# Patient Record
Sex: Female | Born: 1975 | Race: Black or African American | Hispanic: No | Marital: Single | Smoking: Current every day smoker
Health system: Southern US, Community
[De-identification: ages and names within clinical notes are randomized; demographics above are authoritative.]

## PROBLEM LIST (undated history)

## (undated) DIAGNOSIS — F319 Bipolar disorder, unspecified: Secondary | ICD-10-CM

## (undated) DIAGNOSIS — F250 Schizoaffective disorder, bipolar type: Secondary | ICD-10-CM

## (undated) DIAGNOSIS — F259 Schizoaffective disorder, unspecified: Secondary | ICD-10-CM

---

## 2011-06-26 ENCOUNTER — Emergency Department (HOSPITAL_COMMUNITY)
Admission: EM | Admit: 2011-06-26 | Discharge: 2011-06-26 | Disposition: A | Discharge status: Home / Self Care | Payer: Self-pay | Attending: Emergency Medicine | Admitting: Emergency Medicine

## 2011-06-27 ENCOUNTER — Emergency Department (HOSPITAL_COMMUNITY): Payer: Medicaid - Out of State

## 2011-06-27 ENCOUNTER — Emergency Department (HOSPITAL_COMMUNITY)
Admission: EM | Admit: 2011-06-27 | Discharge: 2011-06-28 | Disposition: A | Discharge status: Other Acute Inpatient Hospital | Payer: Medicaid - Out of State | Attending: Emergency Medicine | Admitting: Emergency Medicine

## 2011-06-27 DIAGNOSIS — R51 Headache: Secondary | ICD-10-CM | POA: Insufficient documentation

## 2011-06-27 DIAGNOSIS — R000 Tachycardia, unspecified: Secondary | ICD-10-CM | POA: Insufficient documentation

## 2011-06-27 DIAGNOSIS — F29 Unspecified psychosis not due to a substance or known physiological condition: Secondary | ICD-10-CM | POA: Insufficient documentation

## 2011-06-27 DIAGNOSIS — R209 Unspecified disturbances of skin sensation: Secondary | ICD-10-CM | POA: Insufficient documentation

## 2011-06-27 LAB — URINALYSIS, ROUTINE W REFLEX MICROSCOPIC
Bilirubin Urine: NEGATIVE
Ketones, ur: NEGATIVE mg/dL
Leukocytes, UA: NEGATIVE
Nitrite: NEGATIVE
Specific Gravity, Urine: 1.021 (ref 1.005–1.030)
Urobilinogen, UA: 1 mg/dL (ref 0.0–1.0)

## 2011-06-27 LAB — RAPID URINE DRUG SCREEN, HOSP PERFORMED
Amphetamines: NOT DETECTED
Barbiturates: NOT DETECTED
Benzodiazepines: NOT DETECTED

## 2011-06-27 LAB — DIFFERENTIAL
Basophils Relative: 0 % (ref 0–1)
Eosinophils Absolute: 0.1 10*3/uL (ref 0.0–0.7)
Eosinophils Relative: 2 % (ref 0–5)
Lymphs Abs: 2 10*3/uL (ref 0.7–4.0)
Neutrophils Relative %: 56 % (ref 43–77)

## 2011-06-27 LAB — COMPREHENSIVE METABOLIC PANEL
ALT: 11 U/L (ref 0–35)
AST: 14 U/L (ref 0–37)
Calcium: 9.3 mg/dL (ref 8.4–10.5)
Potassium: 4 mEq/L (ref 3.5–5.1)
Sodium: 139 mEq/L (ref 135–145)
Total Protein: 7.3 g/dL (ref 6.0–8.3)

## 2011-06-27 LAB — CBC
MCV: 79.1 fL (ref 78.0–100.0)
Platelets: 261 10*3/uL (ref 150–400)
RBC: 3.87 MIL/uL (ref 3.87–5.11)
RDW: 14.4 % (ref 11.5–15.5)
WBC: 5.8 10*3/uL (ref 4.0–10.5)

## 2011-06-27 LAB — URINE MICROSCOPIC-ADD ON

## 2011-06-27 LAB — POCT PREGNANCY, URINE: Preg Test, Ur: NEGATIVE

## 2011-06-28 ENCOUNTER — Inpatient Hospital Stay (HOSPITAL_COMMUNITY)
Admission: RE | Admit: 2011-06-28 | Discharge: 2011-07-18 | DRG: 885 | Disposition: A | Discharge status: Home / Self Care | Payer: PRIVATE HEALTH INSURANCE | Source: Ambulatory Visit | Attending: Psychiatry | Admitting: Psychiatry

## 2011-06-28 DIAGNOSIS — D649 Anemia, unspecified: Secondary | ICD-10-CM

## 2011-06-28 DIAGNOSIS — F200 Paranoid schizophrenia: Secondary | ICD-10-CM

## 2011-06-28 DIAGNOSIS — Z79899 Other long term (current) drug therapy: Secondary | ICD-10-CM

## 2011-06-28 DIAGNOSIS — Z7982 Long term (current) use of aspirin: Secondary | ICD-10-CM

## 2011-06-30 NOTE — Assessment & Plan Note (Signed)
Nancy Howell, Nancy Howell NO.:  [id number]  MEDICAL RECORD NO.:  [id number]  LOCATION:  0404                          FACILITY:  BH  PHYSICIAN:  Eulogio Ditch, MD DATE OF BIRTH:  [birth year]  DATE OF ADMISSION:  06/28/2011 DATE OF DISCHARGE:                      PSYCHIATRIC ADMISSION ASSESSMENT   HISTORY OF PRESENT ILLNESS:  A 35 year old African American female with history of schizophrenia paranoid type, was admitted from [facility] on commitment papers.  The patient reported that she moved from [place] to live with a cousin in [place] as they were "tainting her food". She stated that it was causing her to have a stroke.  She stated when she came to live with a cousin in [place] they are also tainting her food and medications.  The patient Depakote level is less than 10 and she was also not taking her Zyprexa.  The patient denies suicidal or homicidal ideation.  She denied hearing voices.  The patient is guarded and gives concrete answers.  PAST PSYCH HOSPITALIZATION:  The patient reported that she has number of hospitalization in [place] includes [facility] four to five times.  SUBSTANCE ABUSE:  The patient denies abusing any drugs at this time. She has a history of using crack cocaine 1 year ago.  LABORATORY DATA:  Valproic acid less than 10, alcohol level less than 11.  Comprehensive metabolic panel within normal limits.  Urine drug screen is negative.  Urine analysis is within normal limits.  CBC hemoglobin 9.9, hematocrit 30.6.  Urine pregnancy test negative.  Physical examination done in [facility] ED within normal limits.  MEDICAL HISTORY:  No active medical issue.  ALLERGIES:  NO KNOWN DRUG ALLERGIES.  MENTAL STATUS EXAM:  The patient is guarded during the interview gives concrete answers.  The patient reported that she has headache.  She can talk on length today.  The patient is paranoid, seems to be  internally preoccupied but denies hearing any voices.  Her speech is soft, slow, mood depressed, affect mood congruent, but she is not suicidal or homicidal.  She is alert, awake, oriented x3.  Memory immediate, recent remote fair.  Attention and concentration poor.  Abstraction ability fair.  Insight and judgment poor.  DIAGNOSIS:  AXIS I:  Chronic schizophrenia paranoid type. AXIS II:  Deferred. AXIS III:  No active medical issue. AXIS IV:  Mental health issues. AXIS V:  40.  TREATMENT PLAN:  The patient will be continued on Depakote 500 mg twice a day.  She is on Zyprexa 5 mg at bedtime.  We will continue this medication and titrate it slowly depending upon the patient's symptoms. The patient will be also given Benadryl 50 mg for insomnia as p.r.n. and to prevent any EPS symptoms.  The patient is also on Ativan 1 mg every 6 hours p.r.n. along with Zyprexa 5 mg every 6 hours p.r.n.  Estimated length of stay in the hospital will be 5-7 days.  Will get more collateral information on this patient and we will also get the last discharge summary from the [facility] in [place].     Eulogio Ditch, MD  SA/MEDQ  D:  06/29/2011  T:  06/29/2011  Job:  [id number]  Electronically Signed by Eulogio Ditch  on 06/30/2011 08:19:01 AM

## 2011-07-02 LAB — IRON AND TIBC
Iron: 55 ug/dL (ref 42–135)
Saturation Ratios: 14 % — ABNORMAL LOW (ref 20–55)
TIBC: 401 ug/dL (ref 250–470)
UIBC: 346 ug/dL

## 2011-07-02 LAB — VALPROIC ACID LEVEL: Valproic Acid Lvl: 123.4 ug/mL — ABNORMAL HIGH (ref 50.0–100.0)

## 2011-07-02 LAB — CARDIAC PANEL(CRET KIN+CKTOT+MB+TROPI)
CK, MB: 1.4 ng/mL (ref 0.3–4.0)
Relative Index: INVALID (ref 0.0–2.5)
Total CK: 77 U/L (ref 7–177)
Troponin I: <0.3 ng/mL

## 2011-07-02 LAB — VITAMIN B12: Vitamin B-12: 612 pg/mL (ref 211–911)

## 2011-07-02 LAB — FERRITIN: Ferritin: 6 ng/mL — ABNORMAL LOW (ref 10–291)

## 2011-07-03 LAB — TSH: TSH: 0.373 u[IU]/mL (ref 0.350–4.500)

## 2011-07-15 DIAGNOSIS — F339 Major depressive disorder, recurrent, unspecified: Secondary | ICD-10-CM

## 2011-07-19 NOTE — Assessment & Plan Note (Signed)
Nancy Howell, NADER NO.:  [id number]  MEDICAL RECORD NO.:  [id number]  LOCATION:  0404                          FACILITY:  BH  PHYSICIAN:  Eulogio Ditch, MD DATE OF BIRTH:  12/12/1975  DATE OF ADMISSION:  06/28/2011 DATE OF DISCHARGE:                      PSYCHIATRIC ADMISSION ASSESSMENT   HISTORY OF PRESENT ILLNESS:  This is a 35 year old single African American female.  The commitment papers indicate that she is diagnosed as schizophrenic or schizoaffective bipolar type.  She has been hospitalized several times in [place], [place] at [facility].  She came to [place] 2 weeks ago because of paranoia.  She is still thinking that her family is trying to hurt her by contaminating her food, her makeup, etc., and she was now talking about suicide. [facility] [employer] took her to [place] yesterday, but she walked off.  So, today she was picked up and brought to the emergency department at [facility].  She thinks that she have a stroke back in April, and CT scan was done.  There was no evidence for a present stroke or a past strokes, and she believes that other people are jealous of her and tainting her food.  She stated that she was eating something, a sandwich, maybe from a [employer], and it made her "get high."  She has been clean and sober for 18 months.  She reports that she finished a program year at [place], [place].  PAST PSYCHIATRIC HISTORY:  She has had 4-5 inpatient stays she has been noncompliant with her medication for a few months.  She states she first began getting mental health treatment at age 26.  Unfortunately, her mother was an addict.  Her mother used heroin, cocaine and alcohol, and she always had learning differences.  SOCIAL HISTORY:  She went to the tenth grade.  She has never married. She has 5 children, none of whom are in her care.  She had 3 different fathers.  She states that she has not been allowed to get  SSI for 7 years.  She claims she cannot work, that she cut her wrists last year, and she is pending reinstatement of her SSI.  ALCOHOL AND DRUG HISTORY:  Her own alcohol and drug history, she is very proud of the fact that she has been clean for 18 months.  She is vague on details, but she used to use crack daily herself.  PRIMARY CARE PROVIDER:  She does not have one.  PSYCHIATRIC CARE:  She has established care up in [place], [place].  MEDICATIONS:  She is supposed to be taking: 1. Zyprexa 5 mg h.s. 2. Depakote at least 500 mg at bedtime.  DRUG ALLERGIES:  She has no known drug allergies.  POSITIVE PHYSICAL FINDINGS:  Well-developed, well-nourished African American female who has an unusual physical presentation and that she shaved her hair off.  She, otherwise, appears to be her stated age.  She is in no acute distress at this time.  Her vital signs showed she was afebrile 98.4-98.9, pulse was 92-102, respirations were 15-18 and blood pressure was 105/67 to 123/75.  Her urinalysis was negative.  She was anemic.  Hemoglobin 9.9, hematocrit is 30.6.  Interestingly enough, she had no abnormalities of her metabolic profile at all, and she had a completely negative UDS and no measurable alcohol.  MENTAL STATUS EXAM:  Tonight, she is alert and oriented.  She is appropriately groomed and dressed.  She appears to be adequately nourished.  Her speech was not pressured.  Her mood was, she could hold it together for a while, and then she lapsed into being paranoid.  Her thought processes are somewhat clear, rational and goal oriented.  She wants to get SSI reinstated as she "cannot work."  Judgment and insight are poor, concentration and memory are superficially intact. Intelligence is average to below average.  She is not actively suicidal or homicidal.  She is not having auditory or visual hallucinations per se, although, she does endorse feeling that people are poisoning her food and  watching her.  DIAGNOSES:  AXIS I:  Schizophrenia paranoid type with exacerbation due to noncompliance. AXIS II:  Deferred. AXIS III:  The patient reports a history for stroke a few months ago. However, there is no CT evidence nor physical findings to support this claim. AXIS IV:  No supports, limited finances.  She recently moved here from [place]. AXIS V:  30.  PLAN:  The plan is to admit for safety and stabilization.  We will reestablish compliance with her medications.  We will get her records from [facility] tomorrow, and we will be in contact with her family regarding discharge plans.     Mickie Leonarda Salon, P.A.-C.   ______________________________ Eulogio Ditch, MD    MD/MEDQ  D:  06/28/2011  T:  06/29/2011  Job:  [id number]  Electronically Signed by Jaci Lazier ADAMS P.A.-C. on 07/16/2011 01:07:45 PM Electronically Signed by Eulogio Ditch  on 07/19/2011 03:01:16 PM

## 2011-07-24 NOTE — Discharge Summary (Signed)
  Nancy Howell, Nancy Howell NO.:  [id number]  MEDICAL RECORD NO.:  [id number]  LOCATION:  0404                          FACILITY:  BH  PHYSICIAN:  Eulogio Ditch, MD DATE OF BIRTH:  [birth year]  DATE OF ADMISSION:  06/28/2011 DATE OF DISCHARGE:  07/18/2011                              DISCHARGE SUMMARY   HISTORY OF PRESENT ILLNESS:  Please see initial psych assessment for details.  Briefly, a 35 year old [place] American female with a history of schizophrenia who was admitted because she was very paranoid, was in her 44s tainted, and her medications were also tainted, so she was not taking her medications.  HOSPITAL COURSE:  During the hospital stay the patient was started on Haldol 5 mg at bedtime which was increased to 10 mg at bedtime along with Cogentin 1 mg twice a day.  The patient was also given Depakote 500 mg daily at bedtime.  The patient responded to the medication well without any side effects.  Her Depakote level on August 19 was 123 which increased after increasing the Depakote from 500 to 1000.  Her initial Depakote level on August 14 was less than 10.  So as her Depakote level went up to 123, the Depakote was reduced to 500 mg.  During the hospital stay, the patient did not participate in any groups, stayed all the time in her room.  But as reported by the staff, the patient was interacting good with other patient's and the staff, but the only thing was, she was not going to the groups.  On 07/18/2011, the patient was seen in the treatment team, and as she was logical and goal-directed, not hallucinating or delusional, not suicidal or homicide.  Her insight and judgment improved.  The patient was found stable to be discharged.  The patient's family was involved in the treatment.  The patient was discharged to the shelter.  The patient wanted to go back to [place] as she stated that she would end up with the same crowd, and she would be using  drugs.  DIAGNOSIS AT THE TIME OF DISCHARGE:  AXIS I: Chronic schizophrenia paranoid type AXIS II: Deferred AXIS III: No active medical issue AXIS IV: Chronic mental health issues AXIS V: 55  DISCHARGE MEDICATIONS: 1. Benztropine 1 mg twice daily 2. Haldol 10 mg at bedtime 3. Depakote 500 mg at bedtime 4. Aspirin 81 mg p.o. daily 5. Ferrous sulfate 25 mg p.o. daily 6. Loratadine 10 mg p.o. daily  DISCHARGE FOLLOWUP:  The patient will follow up at [facility], appointment on 09/06 between 9:00 and 11:00 a.m.     Eulogio Ditch, MD     SA/MEDQ  D:  07/19/2011  T:  07/19/2011  Job:  [id number]  Electronically Signed by Eulogio Ditch  on 07/24/2011 01:03:08 PM

## 2011-08-02 ENCOUNTER — Emergency Department (HOSPITAL_COMMUNITY)
Admission: EM | Admit: 2011-08-02 | Discharge: 2011-08-03 | Disposition: A | Discharge status: Other Acute Inpatient Hospital | Payer: Medicaid - Out of State | Attending: Emergency Medicine | Admitting: Emergency Medicine

## 2011-08-02 DIAGNOSIS — F1410 Cocaine abuse, uncomplicated: Secondary | ICD-10-CM | POA: Insufficient documentation

## 2011-08-02 DIAGNOSIS — Z8659 Personal history of other mental and behavioral disorders: Secondary | ICD-10-CM | POA: Insufficient documentation

## 2011-08-02 DIAGNOSIS — R45851 Suicidal ideations: Secondary | ICD-10-CM | POA: Insufficient documentation

## 2011-08-02 DIAGNOSIS — F3130 Bipolar disorder, current episode depressed, mild or moderate severity, unspecified: Secondary | ICD-10-CM | POA: Insufficient documentation

## 2011-08-02 LAB — COMPREHENSIVE METABOLIC PANEL
ALT: 10 U/L (ref 0–35)
Alkaline Phosphatase: 98 U/L (ref 39–117)
Chloride: 103 mEq/L (ref 96–112)
GFR calc Af Amer: >60 mL/min (ref >60)
Glucose, Bld: 83 mg/dL (ref 70–99)
Potassium: 3.8 mEq/L (ref 3.5–5.1)
Sodium: 139 mEq/L (ref 135–145)
Total Protein: 7.7 g/dL (ref 6.0–8.3)

## 2011-08-02 LAB — RAPID URINE DRUG SCREEN, HOSP PERFORMED
Amphetamines: NOT DETECTED
Barbiturates: NOT DETECTED
Benzodiazepines: NOT DETECTED

## 2011-08-02 LAB — CBC
Hemoglobin: 12.2 g/dL (ref 12.0–15.0)
MCHC: 33.1 g/dL (ref 30.0–36.0)
RBC: 4.6 MIL/uL (ref 3.87–5.11)
WBC: 8.1 10*3/uL (ref 4.0–10.5)

## 2011-08-03 ENCOUNTER — Inpatient Hospital Stay (HOSPITAL_COMMUNITY)
Admission: AD | Admit: 2011-08-03 | Discharge: 2011-08-08 | DRG: 897 | Disposition: A | Discharge status: Home / Self Care | Payer: PRIVATE HEALTH INSURANCE | Source: Ambulatory Visit | Attending: Psychiatry | Admitting: Psychiatry

## 2011-08-03 DIAGNOSIS — R45851 Suicidal ideations: Secondary | ICD-10-CM

## 2011-08-03 DIAGNOSIS — F1420 Cocaine dependence, uncomplicated: Secondary | ICD-10-CM

## 2011-08-03 DIAGNOSIS — Z7982 Long term (current) use of aspirin: Secondary | ICD-10-CM

## 2011-08-03 DIAGNOSIS — F17200 Nicotine dependence, unspecified, uncomplicated: Secondary | ICD-10-CM

## 2011-08-03 DIAGNOSIS — Z880 Allergy status to penicillin: Secondary | ICD-10-CM

## 2011-08-03 DIAGNOSIS — Z79899 Other long term (current) drug therapy: Secondary | ICD-10-CM

## 2011-08-03 DIAGNOSIS — F39 Unspecified mood [affective] disorder: Secondary | ICD-10-CM

## 2011-08-04 LAB — HCG, SERUM, QUALITATIVE: Preg, Serum: NEGATIVE

## 2011-08-05 LAB — URINALYSIS, ROUTINE W REFLEX MICROSCOPIC
Bilirubin Urine: NEGATIVE
Leukocytes, UA: NEGATIVE
Nitrite: NEGATIVE
Specific Gravity, Urine: 1.014 (ref 1.005–1.030)
Urobilinogen, UA: 0.2 mg/dL (ref 0.0–1.0)

## 2011-08-11 NOTE — Assessment & Plan Note (Signed)
Nancy Howell, SAVOIA NO.:  [id number]  MEDICAL RECORD NO.:  [id number]  LOCATION:  0301                          FACILITY:  BH  PHYSICIAN:  Orson Aloe, MD       DATE OF BIRTH:  [birth year]  DATE OF ADMISSION:  08/03/2011 DATE OF DISCHARGE:                      PSYCHIATRIC ADMISSION ASSESSMENT   TIME OF ASSESSMENT:  1430 hours.  IDENTIFYING INFORMATION:  This is a 35 year old African American female. This is a voluntary admission.  HISTORY OF PRESENT ILLNESS:  This is the second [facility] admission for Nancy Howell, a 35 year old who presents with an exacerbation of her polysubstance abuse and reported suicidal thoughts with a plan to walk out in front of traffic.  She had numerous IV needles in her hand when she arrived in triage.  Said she needed to get away from her family because she was unable to stay clean and sober.  She reports she has been using crack cocaine and alcohol heavily and is asking for help with substance abuse. She says that the Haldol injection that she received a month ago here at [facility] she felt was going to help keep her clean and sober but her family situation ends up exposing her to drugs on a regular basis.  She reports she has not taken any oral medications since she left [facility].  PAST PSYCHIATRIC HISTORY:  Previously admitted to [facility] from August 15 to July 18, 2003.  She received a Haldol decanoate injection on June 29, 2011, and was also started on oral Haldol dosing with a discharge dose of 10 mg Haldol immediate release q.h.s.  She was also discharged on 500 mg of Depakote q.h.s.  She has a history of psychosis, believing that family members were tainting her food with some type of poison and she had been refusing food and fluid.  She had previous hospitalization in [place] four to five times and had previously taken Zyprexa.  She was initially started on Zyprexa at [facility] but changed to Haldol in order to receive a Haldol  decanoate injection and for less potential for metabolic complications.  She was previously discharged with a plan to follow up at [facility] but did not keep her followup appointment.  SOCIAL HISTORY:  She completed the tenth grade, never married, has 5 children, none of whom are in her care.  She reports a history of previous suicide attempt by cutting her wrists last year.  She has a history of using crack cocaine daily but also has been clean at one point recently for up to 18 months.  She reports living with family here in [place].  FAMILY HISTORY:  Not available.  MEDICAL HISTORY:  No regular primary care provider.  MEDICAL PROBLEMS:  History of psychosis rule out schizophrenia versus bipolar disorder.  Previous hospitalizations for childbirth and previous hospitalizations in [place] for psychiatric reasons.  CURRENT MEDICATIONS:  Are none.  DRUG ALLERGIES:  PENICILLIN.  PHYSICAL EXAM:  Was done in the emergency room where she was medically stabilized.  This is a slim built [place] American female who was very sleepy today.  Feels that the medication is making her drowsy.  She weighs 61 kg, 5 feet 1 inch tall.  Temperature 98, pulse 92, respirations 15, blood pressure 100/68.  Her CBC is normal with a hemoglobin of 12.2.  Chemistry normal with sodium 139, potassium 3.8, chloride 103, carbon dioxide 29, BUN 7, creatinine 0.60.  Liver enzymes are normal.  Urine drug screen positive for cocaine.  Alcohol screen is negative.  Serum pregnancy test and routine UA are currently pending.  MENTAL STATUS EXAM:  This is a sleepy African American female who is bundled up under the covers.  Reports that she last ate last night and has kept a little bit of food down this morning but is too sleepy to eat regular meals.  Says she feels like the medication is making her drowsy and she received 10 mg p.o. of Haldol last night.  Thinking is logical. Today she is able to  respond to me about time frames, wants help getting back off drugs and can talk to me logically about the situation at home which is not good and her food preferences.  No delusional statements made.  No evidence of auditory hallucinations and no signs of internal distraction.  She denies suicidal thoughts today.  DIAGNOSIS:  AXIS I:  Mood disorder not otherwise specified.  Cocaine abuse rule out dependence. AXIS II:  Deferred. AXIS III:  No diagnosis. AXIS IV:  Significant social issues and home issues. AXIS V:  Current 40, past year not known.  PLAN:  To voluntarily admit her with a goal of alleviating her suicidal thoughts.  I am going to change her Haldol oral dose to 5 mg q.6 hours p.r.n. for psychosis until we are able to get a more coherent history and description of her symptoms.  We note that she did have a Haldol decanoate injection 1 month ago but will wait until she is more fully alert to discuss this treatment plan.  Meanwhile we are going to check a urinalysis, valproic acid level and serum HCG and she is getting 500 mg of Depakote q.h.s. and we are repeating her previous dose of ferrous sulfate daily and Claritin 10 mg daily and aspirin 81 mg daily.     Margaret A. Lorin Picket, N.P.   ______________________________ Orson Aloe, MD    MAS/MEDQ  D:  08/04/2011  T:  08/04/2011  Job:  [id number]  Electronically Signed by Kari Baars N.P. on 08/09/2011 05:06:07 PM Electronically Signed by Orson Aloe  on 08/11/2011 11:30:00 AM

## 2011-08-15 NOTE — Discharge Summary (Signed)
Nancy Howell, Nancy Howell.:  [id number]  MEDICAL RECORD NO.:  [id number]  LOCATION:  0301                          FACILITY:  BH  PHYSICIAN:  Orson Aloe, MD       DATE OF BIRTH:  06-Jan-1976  DATE OF ADMISSION:  08/03/2011 DATE OF DISCHARGE:  08/08/2011                              DISCHARGE SUMMARY   IDENTIFYING INFORMATION:  This is a 35 year old African American female. This is a voluntary admission.  HISTORY OF PRESENT ILLNESS:  Second [facility] admission for Nikhita, a 35 year old who presented with an exacerbation of polysubstance abuse and suicidal thoughts with a plan to walk out in front of traffic.  She had numerous IV needles in her hand when she arrived in triage at our emergency room. Said she needed to get away from her family because she was unable to stay clean and sober.  Reported that she had been using cocaine and alcohol heavily.  She had previously been on our unit and was discharged September 4 and at that time was stabilized with Haldol and Depakote. She also had received a Haldol Decanoate injection.  She reported not taking any medications since she left [facility] or keeping any follow-up appointments.  MEDICAL EVALUATION:  She was medically evaluated in the emergency room with no focal neurologic findings.  This is a normally developed female with no abnormal movements, normal gait, 61 kg 5 feet 1 inch tall. Vital signs were normal.  Chemistry, CBC unremarkable.  Normal renal function with normal liver enzymes.  Urine drug screen positive for cocaine.  Metabolites and alcohol screen negative.  COURSE OF HOSPITALIZATION:  We evaluated her valproate level which was 26.2 at the time of admission.  Urinalysis was normal.  Her group participation was spotty throughout her stay.  Serum HCG pregnancy test was found to be negative.  We elected to start her back on Haldol 10 mg p.o. q.h.s. and Depakote 500 mg p.o. q.h.s. along with ferrous  sulfate that she had been prescribed for anemia and Claritin 10 mg daily for perennial allergies.  Our case manager worked to get her into an ACT team.  She had a history of chronic substance abuse with recurrent and frequent relapses.  She expressed that her family was interested in having her placed for long- term treatment.  Ultimately, she declined staying here in [place] for treatment. Our case manager contacted her fiance who wires her 150 dollars to get a bus ticket to go home to [place].  She planned to go straight to the bus station and go on to [place] and stay with her fiance there and obtain treatment.  By the 25th she was in full contact with reality, alert and oriented, recent, remote memory intact.  Judgment and insight adequate and had no dangerous ideas.  DISCHARGE/PLAN:  Is to follow up with [facility] in [place], [place] on August 17, 2011 at 11:40 a.m.  DISCHARGE DIAGNOSIS:  Axis I:  Cocaine dependence, nicotine dependence, schizoaffective disorder. Axis II: Deferred. Axis III: No diagnosis. Axis IV: Moderate psychosocial stressors. Axis V: Current 50, past year not known.  DISCHARGE CONDITION:  Stable.  DISCHARGE MEDICATIONS: 1. Aspirin 81 mg daily. 2. Benztropine 1 mg q.h.s. 3. Depakote 500 mg q.h.s. 4. Ferrous sulfate 325 mg daily with a meal. 5. Haldol 2 mg b.i.d. 6. Loratadine 10 mg daily. 7. Trazodone 50 mg q.h.s.     Margaret A. Lorin Picket, N.P.   ______________________________ Orson Aloe, MD    MAS/MEDQ  D:  08/15/2011  T:  08/15/2011  Job:  [id number]  Electronically Signed by Kari Baars N.P. on 08/15/2011 01:30:28 PM Electronically Signed by Orson Aloe  on 08/15/2011 03:29:30 PM

## 2012-03-30 IMAGING — CT CT HEAD W/O CM
1 series · 16 of 28 positions shown, 20 images · non-contrast
Comparison: None.

CLINICAL DATA: Medical clearance.  Depression and anxiety.

CT HEAD WITHOUT CONTRAST
TECHNIQUE: Contiguous axial images were obtained from the base of
the skull through the vertex without contrast.

[Series 2: head_seq 4.5 h37s st · axial · 0.43mm/px · z∈[+1231,+1343]mm · 16 of 28 slices shown, 20 images]
[im 2/28  brain]
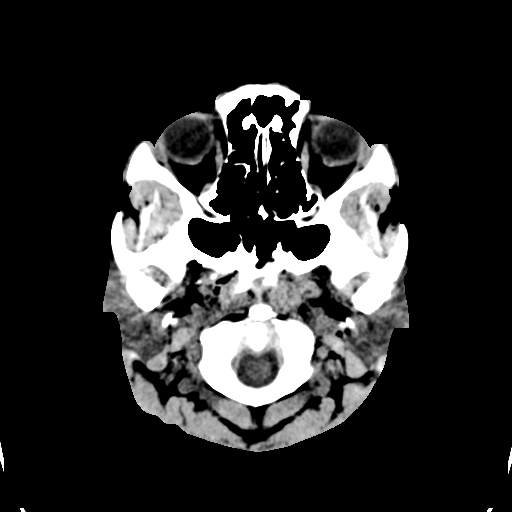
[im 2/28  bone]
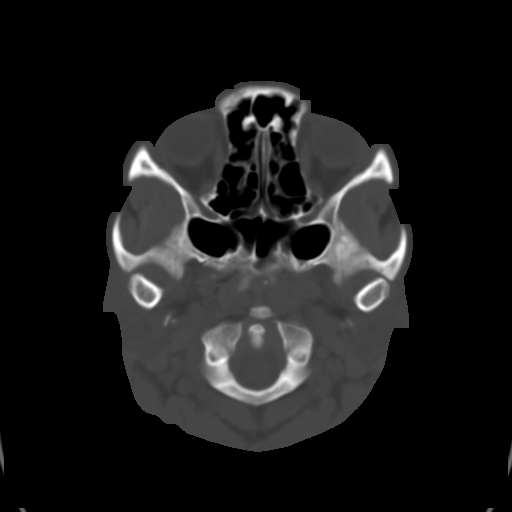
[im 4/28  brain]
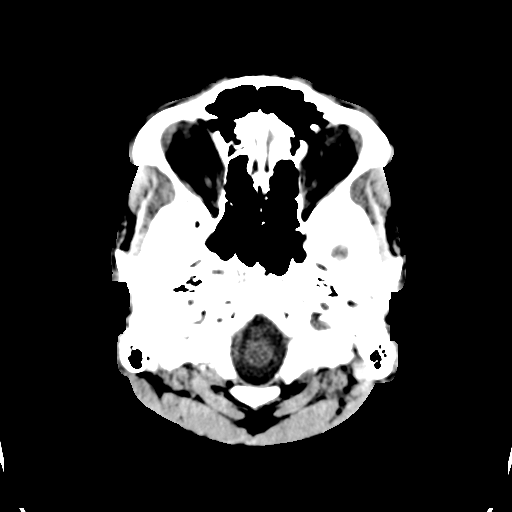
[im 6/28  brain]
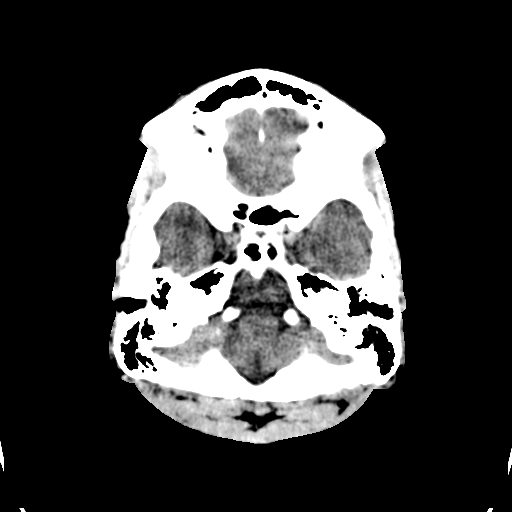
[im 7/28  brain]
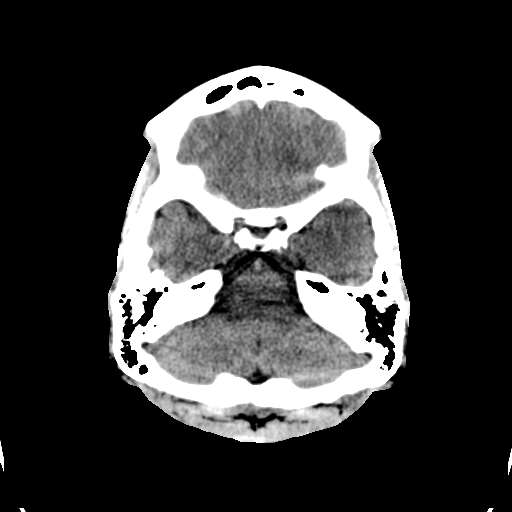
[im 9/28  brain]
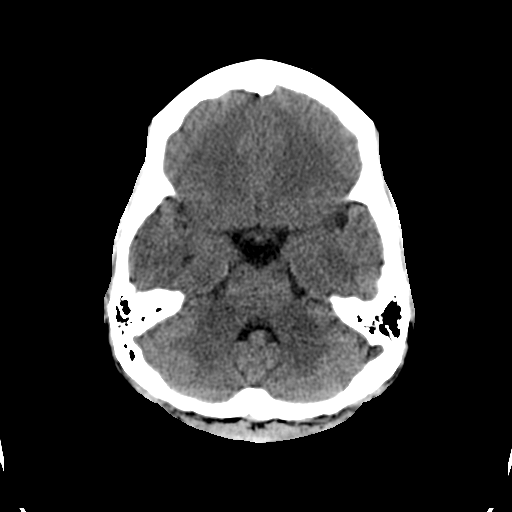
[im 9/28  bone]
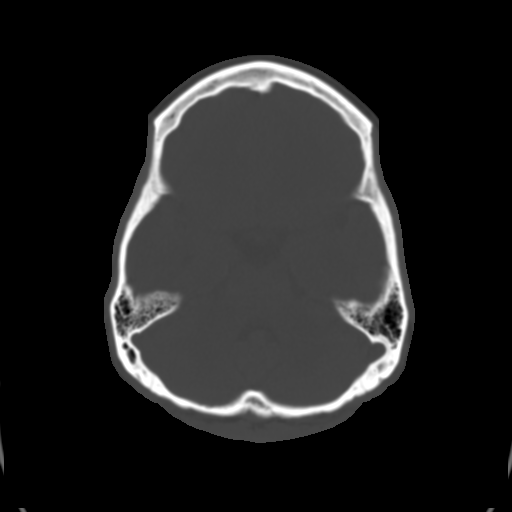
[im 10/28  brain]
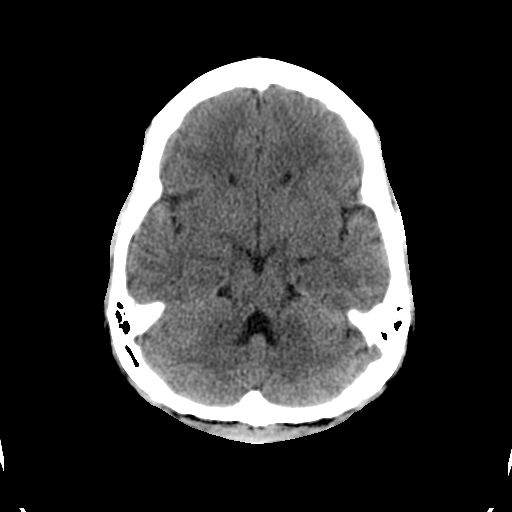
[im 12/28  brain]
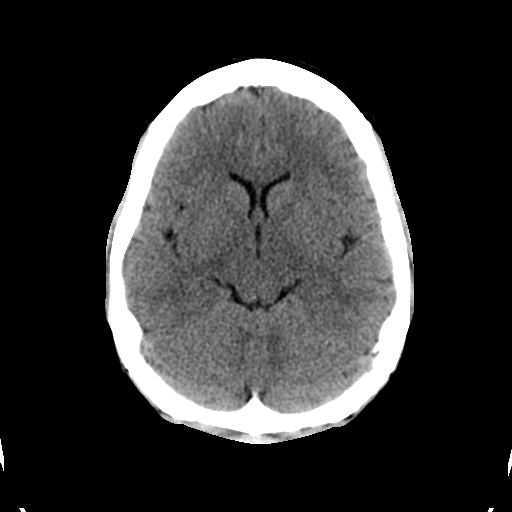
[im 14/28  brain]
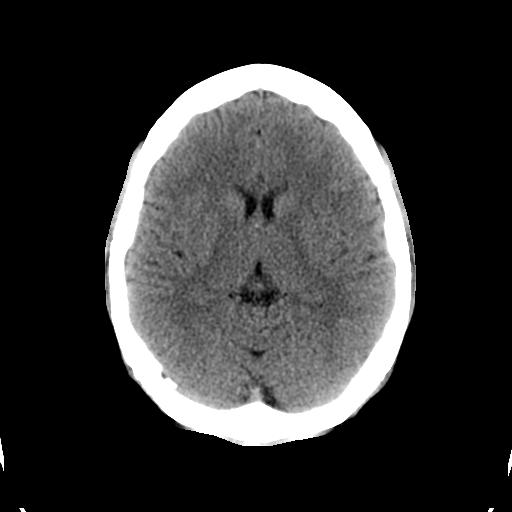
[im 15/28  brain]
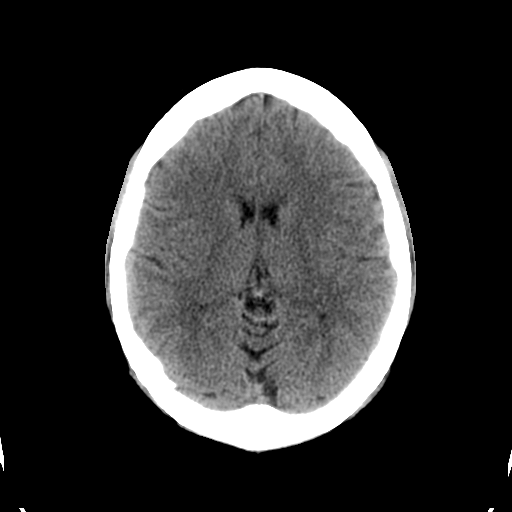
[im 15/28  bone]
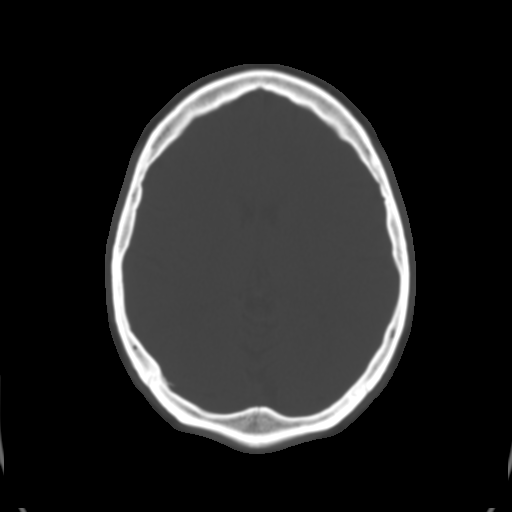
[im 17/28  brain]
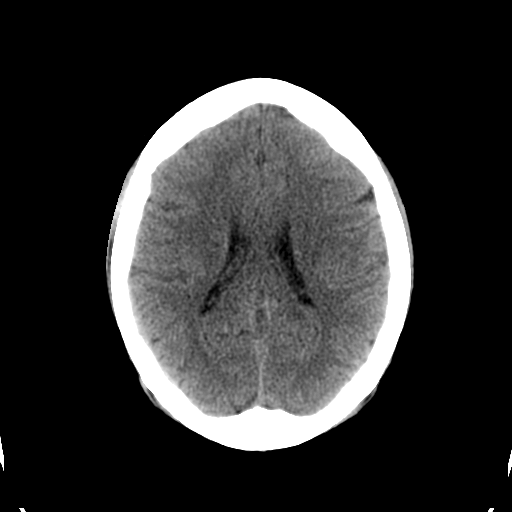
[im 19/28  brain]
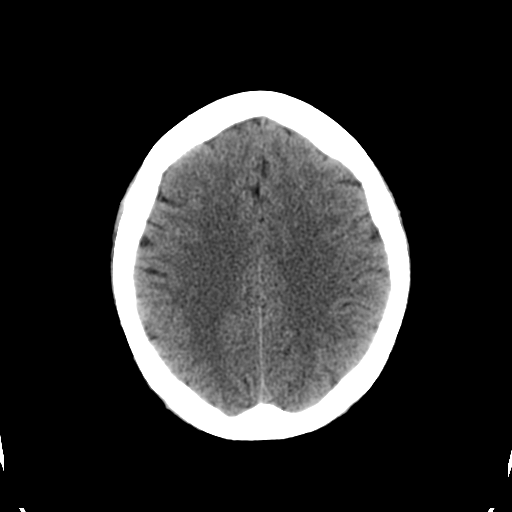
[im 20/28  brain]
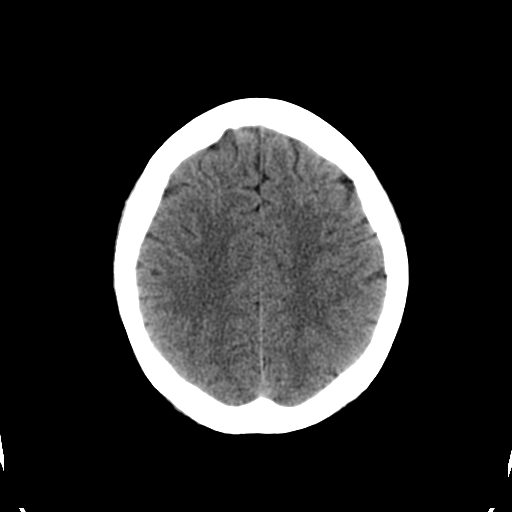
[im 22/28  brain]
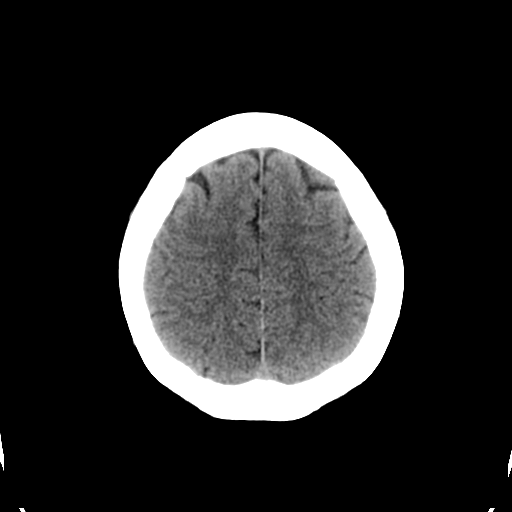
[im 22/28  bone]
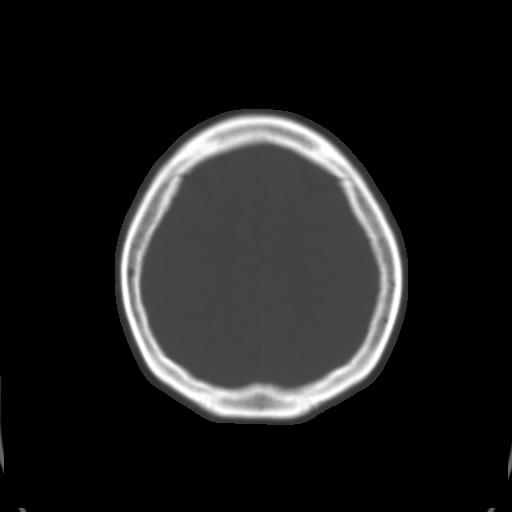
[im 23/28  brain]
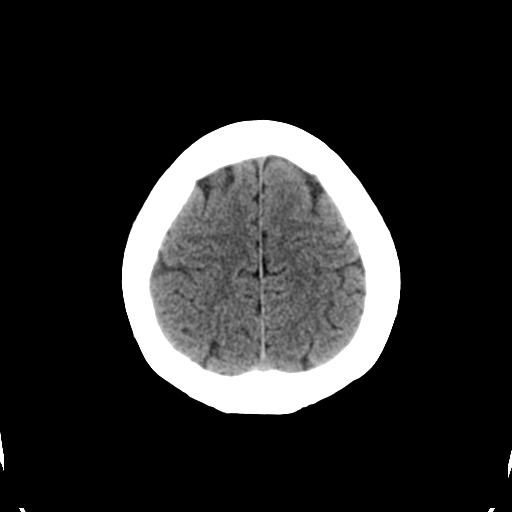
[im 25/28  brain]
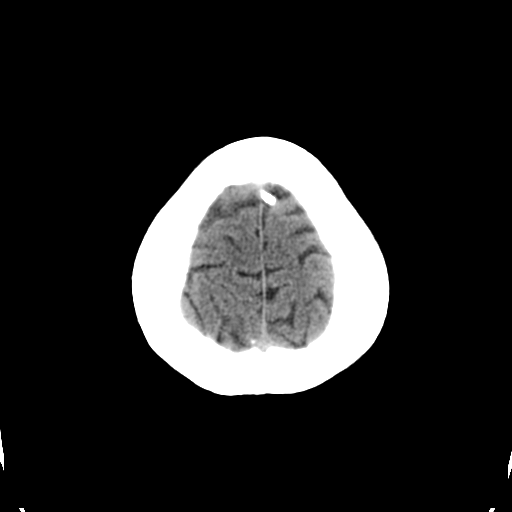
[im 27/28  brain]
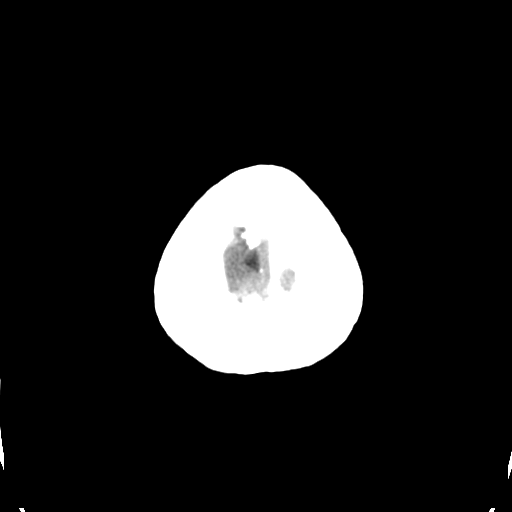

[16 of 28 positions shown; findings below may reference images not displayed]

FINDINGS: There is no evidence of acute intracranial hemorrhage,
mass lesion, brain edema or extra-axial fluid collection.  The
ventricles and subarachnoid spaces are appropriately sized for age.
There is no CT evidence of acute cortical infarction.

There is minimal ethmoid sinus mucosal thickening.  Visualized
paranasal sinuses are otherwise clear. The calvarium is intact.
IMPRESSION: No acute intracranial findings.  Minimal ethmoid sinus mucosal
thickening.

## 2016-10-11 ENCOUNTER — Encounter (HOSPITAL_COMMUNITY): Payer: Self-pay | Admitting: Emergency Medicine

## 2016-10-11 ENCOUNTER — Emergency Department (HOSPITAL_COMMUNITY)
Admission: EM | Admit: 2016-10-11 | Discharge: 2016-10-11 | Disposition: A | Discharge status: Home / Self Care | Payer: Medicaid - Out of State | Attending: Emergency Medicine | Admitting: Emergency Medicine

## 2016-10-11 DIAGNOSIS — Z760 Encounter for issue of repeat prescription: Secondary | ICD-10-CM | POA: Insufficient documentation

## 2016-10-11 DIAGNOSIS — F17210 Nicotine dependence, cigarettes, uncomplicated: Secondary | ICD-10-CM | POA: Insufficient documentation

## 2016-10-11 HISTORY — DX: Bipolar disorder, unspecified: F31.9

## 2016-10-11 HISTORY — DX: Schizoaffective disorder, bipolar type: F25.0

## 2016-10-11 HISTORY — DX: Schizoaffective disorder, unspecified: F25.9

## 2016-10-11 MED ORDER — HALOPERIDOL DECANOATE 100 MG/ML IM SOLN
100.0000 mg | Freq: Once | INTRAMUSCULAR | Status: AC
  Administered 2016-10-11: 100 mg via INTRAMUSCULAR
  Filled 2016-10-11 (×2): qty 1

## 2016-10-11 NOTE — ED Triage Notes (Signed)
Patient from [place] with schizoaffective disorder [place] and now needs her monthly Haldol shot and refill on meds until she finds a doctor down here.  Patient reports she is not depressed or having an episode only needs her meds.

## 2016-10-11 NOTE — Discharge Instructions (Signed)
[place]. Take your medications.

## 2016-10-11 NOTE — ED Provider Notes (Signed)
[facility]-EMERGENCY DEPT Provider Note   CSN: [id number] Arrival date & time: 10/11/16  1349     History   Chief Complaint Chief Complaint  Patient presents with  . Medication Refill    HPI Nancy Howell is a 40 y.o. female.  HPI Patient presents requesting her monthly Haldol shot. States she is on 100 mg IM monthly. States she last had at the beginning of October. [place] She has frequent psychiatric issues up there. She is on Depakote and Haldol and Cogentin. States she also gets 100 mg of the depo Haldol. States she is not depressed. Not hallucinating. She does not have resources here yet.   Past Medical History:  Diagnosis Date  . Bipolar 1 disorder (HCC)   . Schizo affective schizophrenia (HCC)     There are no active problems to display for this patient.   No past surgical history on file.  OB History    No data available       Home Medications    Prior to Admission medications   Medication Sig Start Date End Date Taking? Authorizing Provider  benztropine (COGENTIN) 0.5 MG tablet Take 0.5 mg by mouth 2 (two) times daily as needed for tremors.   Yes Historical Provider, MD  divalproex (DEPAKOTE) 500 MG DR tablet Take 500 mg by mouth at bedtime.   Yes Historical Provider, MD  haloperidol (HALDOL) 5 MG tablet Take 5 mg by mouth at bedtime.   Yes Historical Provider, MD  haloperidol decanoate (HALDOL DECANOATE) 100 MG/ML injection Inject 100 mg into the muscle every 28 (twenty-eight) days.   Yes Historical Provider, MD    Family History No family history on file.  Social History Social History  Substance Use Topics  . Smoking status: Current Every Day Smoker    Packs/day: 0.50    Types: Cigarettes  . Smokeless tobacco: Not on file  . Alcohol use No     Allergies   Banana and Penicillins   Review of Systems Review of Systems  Constitutional: Negative for appetite change.  HENT: Negative for congestion.   Respiratory:  Negative for shortness of breath.   Cardiovascular: Negative for chest pain.  Gastrointestinal: Negative for abdominal pain.  Genitourinary: Negative for dysuria.  Musculoskeletal: Negative for back pain.  Neurological: Negative for seizures.  Psychiatric/Behavioral: Negative for decreased concentration.     Physical Exam Updated Vital Signs BP 111/73 (BP Location: Left Arm)   Pulse 90   Temp 98.1 F (36.7 C)   Resp 16   Ht 5\' 1"  (1.549 m)   Wt 130 lb (59 kg)   LMP 10/08/2016   SpO2 100%   BMI 24.56 kg/m   Physical Exam  Constitutional: She appears well-developed.  HENT:  Head: Atraumatic.  Eyes: EOM are normal.  Cardiovascular: Normal rate.   Pulmonary/Chest: Effort normal.  Abdominal: Soft.  Musculoskeletal: She exhibits no edema.  Neurological: She is alert.  Skin: Skin is warm.  Psychiatric: She has a normal mood and affect.     ED Treatments / Results  Labs (all labs ordered are listed, but only abnormal results are displayed) Labs Reviewed - No data to display  EKG  EKG Interpretation None       Radiology No results found.  Procedures Procedures (including critical care time)  Medications Ordered in ED Medications  haloperidol decanoate (HALDOL DECANOATE) 100 MG/ML injection 100 mg (100 mg Intramuscular Given 10/11/16 1602)     Initial Impression / Assessment and  Plan / ED Course  I have reviewed the triage vital signs and the nursing notes.  Pertinent labs & imaging results that were available during my care of the patient were reviewed by me and considered in my medical decision making (see chart for details).  Clinical Course     Patient presents requesting her monthly Haldol depo shot. Has not been taking her other medicines as prescribed either. Will give to patient. We'll give some resources for psychiatric follow-up. Will discharge home. She has had this previously.   Final Clinical Impressions(s) / ED Diagnoses   Final  diagnoses:  Medication refill    New Prescriptions New Prescriptions   No medications on file     Benjiman CoreNathan Jaylynn Siefert, MD 10/11/16 1606

## 2016-11-04 ENCOUNTER — Encounter (HOSPITAL_COMMUNITY): Payer: Self-pay | Admitting: Certified Nurse Midwife

## 2016-11-04 ENCOUNTER — Emergency Department (HOSPITAL_COMMUNITY)
Admission: EM | Admit: 2016-11-04 | Discharge: 2016-11-04 | Disposition: A | Discharge status: Home / Self Care | Payer: Medicaid - Out of State | Attending: Emergency Medicine | Admitting: Emergency Medicine

## 2016-11-04 DIAGNOSIS — R102 Pelvic and perineal pain: Secondary | ICD-10-CM | POA: Diagnosis not present

## 2016-11-04 DIAGNOSIS — F17210 Nicotine dependence, cigarettes, uncomplicated: Secondary | ICD-10-CM | POA: Diagnosis not present

## 2016-11-04 DIAGNOSIS — R1030 Lower abdominal pain, unspecified: Secondary | ICD-10-CM | POA: Diagnosis present

## 2016-11-04 LAB — CBC
HCT: 35.4 % — ABNORMAL LOW (ref 36.0–46.0)
HEMOGLOBIN: 11.3 g/dL — AB (ref 12.0–15.0)
MCH: 25.1 pg — AB (ref 26.0–34.0)
MCHC: 31.9 g/dL (ref 30.0–36.0)
MCV: 78.7 fL (ref 78.0–100.0)
Platelets: 193 10*3/uL (ref 150–400)
RBC: 4.5 MIL/uL (ref 3.87–5.11)
RDW: 16.8 % — ABNORMAL HIGH (ref 11.5–15.5)
WBC: 4.6 10*3/uL (ref 4.0–10.5)

## 2016-11-04 LAB — WET PREP, GENITAL
CLUE CELLS WET PREP: NONE SEEN
Sperm: NONE SEEN
TRICH WET PREP: NONE SEEN
YEAST WET PREP: NONE SEEN

## 2016-11-04 LAB — URINALYSIS, ROUTINE W REFLEX MICROSCOPIC
Bilirubin Urine: NEGATIVE
Glucose, UA: NEGATIVE mg/dL
HGB URINE DIPSTICK: NEGATIVE
Ketones, ur: NEGATIVE mg/dL
LEUKOCYTES UA: NEGATIVE
Nitrite: NEGATIVE
Protein, ur: NEGATIVE mg/dL
SPECIFIC GRAVITY, URINE: 1.012 (ref 1.005–1.030)
pH: 6 (ref 5.0–8.0)

## 2016-11-04 LAB — COMPREHENSIVE METABOLIC PANEL
ALT: 8 U/L — ABNORMAL LOW (ref 14–54)
ANION GAP: 9 (ref 5–15)
AST: 24 U/L (ref 15–41)
Albumin: 3.6 g/dL (ref 3.5–5.0)
Alkaline Phosphatase: 89 U/L (ref 38–126)
BUN: 8 mg/dL (ref 6–20)
CALCIUM: 9.1 mg/dL (ref 8.9–10.3)
CHLORIDE: 107 mmol/L (ref 101–111)
CO2: 22 mmol/L (ref 22–32)
Creatinine, Ser: 0.71 mg/dL (ref 0.44–1.00)
GFR calc non Af Amer: >60 mL/min (ref >60)
Glucose, Bld: 83 mg/dL (ref 65–99)
Potassium: 3.8 mmol/L (ref 3.5–5.1)
SODIUM: 138 mmol/L (ref 135–145)
Total Bilirubin: 0.3 mg/dL (ref 0.3–1.2)
Total Protein: 6.6 g/dL (ref 6.5–8.1)

## 2016-11-04 LAB — LIPASE, BLOOD: LIPASE: 19 U/L (ref 11–51)

## 2016-11-04 LAB — I-STAT BETA HCG BLOOD, ED (MC, WL, AP ONLY)

## 2016-11-04 MED ORDER — KETOROLAC TROMETHAMINE 60 MG/2ML IM SOLN
60.0000 mg | Freq: Once | INTRAMUSCULAR | Status: AC
  Administered 2016-11-04: 60 mg via INTRAMUSCULAR
  Filled 2016-11-04: qty 2

## 2016-11-04 MED ORDER — NAPROXEN 500 MG PO TABS: 500.0000 mg | ORAL_TABLET | Freq: Two times a day (BID) | ORAL | 0 refills | Status: DC

## 2016-11-04 MED ORDER — AZITHROMYCIN 250 MG PO TABS
1000.0000 mg | ORAL_TABLET | Freq: Once | ORAL | Status: AC
  Administered 2016-11-04: 1000 mg via ORAL
  Filled 2016-11-04: qty 4

## 2016-11-04 MED ORDER — CEFTRIAXONE SODIUM 1 G IJ SOLR
500.0000 mg | Freq: Once | INTRAMUSCULAR | Status: AC
  Administered 2016-11-04: 500 mg via INTRAMUSCULAR

## 2016-11-04 NOTE — ED Triage Notes (Signed)
Pt states that around Thanksgiving she began having abdominal pain and vaginal discharge. Pt states she has a hx of ovarian cysts and believes that to be the cause of her current pain.

## 2016-11-04 NOTE — ED Notes (Signed)
NAD at this time. Pt is stable and going home.  

## 2016-11-04 NOTE — ED Provider Notes (Signed)
[facility]-EMERGENCY DEPT Provider Note   CSN: [id number] Arrival date & time: 11/04/16  1040     History   Chief Complaint Chief Complaint  Patient presents with  . Abdominal Pain  . Vaginal Discharge    HPI Nancy Howell is a 40 y.o. female.  HPI  Lower burning abd pain, worsened since yesterday, constant , waxes and wanes for 2 days Hx of cyst rupture Has been seen in [place] for ruptured cyst and feels these symptoms are similar.   No associated symptoms No fevers, no vomiting, no nausea, no diarrhea, no constipation, no vaginal discharge or bleeding  Concerned that may have infection However no concern for STIs, had recent testing which was negatvie  Past Medical History:  Diagnosis Date  . Bipolar 1 disorder (HCC)   . Schizo affective schizophrenia (HCC)     There are no active problems to display for this patient.   History reviewed. No pertinent surgical history.  OB History    No data available       Home Medications    Prior to Admission medications   Medication Sig Start Date End Date Taking? Authorizing Provider  benztropine (COGENTIN) 0.5 MG tablet Take 0.5 mg by mouth 2 (two) times daily as needed for tremors.    Historical Provider, MD  divalproex (DEPAKOTE) 500 MG DR tablet Take 500 mg by mouth at bedtime.    Historical Provider, MD  haloperidol (HALDOL) 5 MG tablet Take 5 mg by mouth at bedtime.    Historical Provider, MD  haloperidol decanoate (HALDOL DECANOATE) 100 MG/ML injection Inject 100 mg into the muscle every 28 (twenty-eight) days.    Historical Provider, MD  naproxen (NAPROSYN) 500 MG tablet Take 1 tablet (500 mg total) by mouth 2 (two) times daily with a meal. 11/04/16   [name], MD    Family History No family history on file.  Social History Social History  Substance Use Topics  . Smoking status: Current Every Day Smoker    Packs/day: 0.50    Types: Cigarettes  . Smokeless tobacco: Never Used  . Alcohol use Yes     Comment:  socially     Allergies   Banana and Penicillins   Review of Systems Review of Systems  Constitutional: Negative for fever.  HENT: Negative for sore throat.   Eyes: Negative for visual disturbance.  Respiratory: Negative for cough and shortness of breath.   Cardiovascular: Negative for chest pain.  Gastrointestinal: Positive for abdominal pain. Negative for constipation, diarrhea, nausea and vomiting.  Genitourinary: Positive for pelvic pain. Negative for difficulty urinating, vaginal bleeding and vaginal discharge.  Musculoskeletal: Negative for back pain and neck pain.  Skin: Negative for rash.  Neurological: Negative for syncope and headaches.     Physical Exam Updated Vital Signs BP 100/73   Pulse 75   Temp 98.8 F (37.1 C) (Oral)   Resp 16   Ht 5\' 1"  (1.549 m)   Wt 130 lb (59 kg)   LMP 10/08/2016   SpO2 100%   BMI 24.56 kg/m   Physical Exam  Constitutional: She is oriented to person, place, and time. She appears well-developed and well-nourished. No distress.  HENT:  Head: Normocephalic and atraumatic.  Eyes: Conjunctivae and EOM are normal.  Neck: Normal range of motion.  Cardiovascular: Normal rate, regular rhythm, normal heart sounds and intact distal pulses.  Exam reveals no gallop and no friction rub.   No murmur heard. Pulmonary/Chest: Effort normal and breath sounds normal. No  respiratory distress. She has no wheezes. She has no rales.  Abdominal: Soft. She exhibits no distension. There is tenderness (mild bilateral and suprapubic). There is no guarding.  Genitourinary: Uterus is not tender. Cervix exhibits no motion tenderness. Right adnexum displays tenderness. Left adnexum displays tenderness. Vaginal discharge (scant) found.  Musculoskeletal: She exhibits no edema or tenderness.  Neurological: She is alert and oriented to person, place, and time.  Skin: Skin is warm and dry. No rash noted. She is not diaphoretic. No erythema.  Nursing note and vitals  reviewed.    ED Treatments / Results  Labs (all labs ordered are listed, but only abnormal results are displayed) Labs Reviewed  WET PREP, GENITAL - Abnormal; Notable for the following:       Result Value   WBC, Wet Prep HPF POC MANY (*)    All other components within normal limits  COMPREHENSIVE METABOLIC PANEL - Abnormal; Notable for the following:    ALT 8 (*)    All other components within normal limits  CBC - Abnormal; Notable for the following:    Hemoglobin 11.3 (*)    HCT 35.4 (*)    MCH 25.1 (*)    RDW 16.8 (*)    All other components within normal limits  LIPASE, BLOOD  URINALYSIS, ROUTINE W REFLEX MICROSCOPIC  I-STAT BETA HCG BLOOD, ED ([facility], [facility], [facility] ONLY)  GC/CHLAMYDIA PROBE AMP ([place]) NOT AT [facility]    EKG  EKG Interpretation None       Radiology No results found.  Procedures Procedures (including critical care time)  Medications Ordered in ED Medications  ketorolac (TORADOL) injection 60 mg (60 mg Intramuscular Given 11/04/16 1427)  cefTRIAXone (ROCEPHIN) injection 500 mg (500 mg Intramuscular Given 11/04/16 1426)  azithromycin (ZITHROMAX) tablet 1,000 mg (1,000 mg Oral Given 11/04/16 1426)     Initial Impression / Assessment and Plan / ED Course  I have reviewed the triage vital signs and the nursing notes.  Pertinent labs & imaging results that were available during my care of the patient were reviewed by me and considered in my medical decision making (see chart for details).  Clinical Course    40yo female presents with concern for abdomina/pelvic pain. Pregnancy test negative. Labs WNL. Urinalysis negative. Wet prep with WBC. Doubt ovarian torsion/nephrolithiasis given no colicky pain, benign exam, bilateral symptoms. Doubt appendicitis/diverticulitis by hx and exam. Pt reports recent testing for STIs, has no  Significant vaginal discharge, no CMT, and overally low suspicion for PID.  Given pt with similar symptoms with cyst rupture in the  past, feel this could be likely etiology. Rec antiinflammatories, PCP and OBGYN follow up. Patient discharged in stable condition with understanding of reasons to return.    Final Clinical Impressions(s) / ED Diagnoses   Final diagnoses:  Lower abdominal pain, suspect ovarian cyst rupture    New Prescriptions Discharge Medication List as of 11/04/2016  2:36 PM    START taking these medications   Details  naproxen (NAPROSYN) 500 MG tablet Take 1 tablet (500 mg total) by mouth 2 (two) times daily with a meal., Starting Sat 11/04/2016, Print         Alvira MondayErin Saveah Bahar, MD 11/04/16 2055

## 2016-11-07 LAB — GC/CHLAMYDIA PROBE AMP (~~LOC~~) NOT AT ARMC
Chlamydia: NEGATIVE
Neisseria Gonorrhea: NEGATIVE

## 2024-08-12 ENCOUNTER — Other Ambulatory Visit: Payer: Self-pay

## 2024-08-12 ENCOUNTER — Encounter (HOSPITAL_COMMUNITY): Payer: Self-pay | Admitting: Psychiatry

## 2024-08-12 ENCOUNTER — Encounter (HOSPITAL_BASED_OUTPATIENT_CLINIC_OR_DEPARTMENT_OTHER): Payer: Self-pay

## 2024-08-12 ENCOUNTER — Inpatient Hospital Stay (HOSPITAL_COMMUNITY)
Admission: AD | Admit: 2024-08-12 | Discharge: 2024-09-03 | DRG: 897 | Disposition: A | Discharge status: Home / Self Care | Source: Intra-hospital

## 2024-08-12 ENCOUNTER — Emergency Department (HOSPITAL_BASED_OUTPATIENT_CLINIC_OR_DEPARTMENT_OTHER)
Admission: EM | Admit: 2024-08-12 | Discharge: 2024-08-12 | Disposition: A | Discharge status: Other Acute Inpatient Hospital | Payer: Self-pay | Attending: Emergency Medicine | Admitting: Emergency Medicine

## 2024-08-12 DIAGNOSIS — F1410 Cocaine abuse, uncomplicated: Secondary | ICD-10-CM | POA: Diagnosis present

## 2024-08-12 DIAGNOSIS — Z91148 Patient's other noncompliance with medication regimen for other reason: Secondary | ICD-10-CM | POA: Diagnosis not present

## 2024-08-12 DIAGNOSIS — F1420 Cocaine dependence, uncomplicated: Secondary | ICD-10-CM

## 2024-08-12 DIAGNOSIS — F259 Schizoaffective disorder, unspecified: Principal | ICD-10-CM | POA: Diagnosis present

## 2024-08-12 DIAGNOSIS — R4781 Slurred speech: Secondary | ICD-10-CM | POA: Diagnosis present

## 2024-08-12 DIAGNOSIS — N939 Abnormal uterine and vaginal bleeding, unspecified: Secondary | ICD-10-CM | POA: Diagnosis present

## 2024-08-12 DIAGNOSIS — F529 Unspecified sexual dysfunction not due to a substance or known physiological condition: Secondary | ICD-10-CM | POA: Diagnosis not present

## 2024-08-12 DIAGNOSIS — F1520 Other stimulant dependence, uncomplicated: Secondary | ICD-10-CM | POA: Diagnosis present

## 2024-08-12 DIAGNOSIS — Z5971 Insufficient health insurance coverage: Secondary | ICD-10-CM

## 2024-08-12 DIAGNOSIS — G2401 Drug induced subacute dyskinesia: Secondary | ICD-10-CM | POA: Diagnosis present

## 2024-08-12 DIAGNOSIS — R109 Unspecified abdominal pain: Secondary | ICD-10-CM | POA: Diagnosis not present

## 2024-08-12 DIAGNOSIS — Z555 Less than a high school diploma: Secondary | ICD-10-CM

## 2024-08-12 DIAGNOSIS — F1994 Other psychoactive substance use, unspecified with psychoactive substance-induced mood disorder: Secondary | ICD-10-CM | POA: Insufficient documentation

## 2024-08-12 DIAGNOSIS — F909 Attention-deficit hyperactivity disorder, unspecified type: Secondary | ICD-10-CM | POA: Diagnosis present

## 2024-08-12 DIAGNOSIS — F411 Generalized anxiety disorder: Secondary | ICD-10-CM | POA: Diagnosis present

## 2024-08-12 DIAGNOSIS — N898 Other specified noninflammatory disorders of vagina: Secondary | ICD-10-CM | POA: Diagnosis present

## 2024-08-12 DIAGNOSIS — Z880 Allergy status to penicillin: Secondary | ICD-10-CM | POA: Diagnosis not present

## 2024-08-12 DIAGNOSIS — M25511 Pain in right shoulder: Secondary | ICD-10-CM | POA: Diagnosis present

## 2024-08-12 DIAGNOSIS — F17210 Nicotine dependence, cigarettes, uncomplicated: Secondary | ICD-10-CM | POA: Diagnosis present

## 2024-08-12 DIAGNOSIS — G40909 Epilepsy, unspecified, not intractable, without status epilepticus: Secondary | ICD-10-CM | POA: Diagnosis present

## 2024-08-12 DIAGNOSIS — F17203 Nicotine dependence unspecified, with withdrawal: Secondary | ICD-10-CM | POA: Diagnosis not present

## 2024-08-12 DIAGNOSIS — F1490 Cocaine use, unspecified, uncomplicated: Secondary | ICD-10-CM | POA: Diagnosis not present

## 2024-08-12 DIAGNOSIS — Z91018 Allergy to other foods: Secondary | ICD-10-CM | POA: Diagnosis not present

## 2024-08-12 DIAGNOSIS — R45851 Suicidal ideations: Secondary | ICD-10-CM | POA: Insufficient documentation

## 2024-08-12 DIAGNOSIS — Z59869 Financial insecurity, unspecified: Secondary | ICD-10-CM | POA: Diagnosis not present

## 2024-08-12 DIAGNOSIS — F250 Schizoaffective disorder, bipolar type: Secondary | ICD-10-CM | POA: Diagnosis present

## 2024-08-12 DIAGNOSIS — F600 Paranoid personality disorder: Secondary | ICD-10-CM | POA: Diagnosis present

## 2024-08-12 DIAGNOSIS — Z9851 Tubal ligation status: Secondary | ICD-10-CM

## 2024-08-12 DIAGNOSIS — Z79899 Other long term (current) drug therapy: Secondary | ICD-10-CM | POA: Diagnosis not present

## 2024-08-12 LAB — URINE DRUG SCREEN
Amphetamines: NEGATIVE
Barbiturates: NEGATIVE
Benzodiazepines: NEGATIVE
Cocaine: POSITIVE — AB
Fentanyl: NEGATIVE
Methadone Scn, Ur: NEGATIVE
Opiates: NEGATIVE
Tetrahydrocannabinol: NEGATIVE

## 2024-08-12 LAB — COMPREHENSIVE METABOLIC PANEL WITH GFR
ALT: 8 U/L (ref 0–44)
AST: 15 U/L (ref 15–41)
Albumin: 4.1 g/dL (ref 3.5–5.0)
Alkaline Phosphatase: 74 U/L (ref 38–126)
Anion gap: 12 (ref 5–15)
BUN: 6 mg/dL (ref 6–20)
CO2: 22 mmol/L (ref 22–32)
Calcium: 8.9 mg/dL (ref 8.9–10.3)
Chloride: 106 mmol/L (ref 98–111)
Creatinine, Ser: 0.52 mg/dL (ref 0.44–1.00)
GFR, Estimated: >60 mL/min (ref >60)
Glucose, Bld: 106 mg/dL — ABNORMAL HIGH (ref 70–99)
Potassium: 3.6 mmol/L (ref 3.5–5.1)
Sodium: 140 mmol/L (ref 135–145)
Total Bilirubin: <0.2 mg/dL (ref 0.0–1.2)
Total Protein: 7.2 g/dL (ref 6.5–8.1)

## 2024-08-12 LAB — CBC
HCT: 36 % (ref 36.0–46.0)
Hemoglobin: 11.6 g/dL — ABNORMAL LOW (ref 12.0–15.0)
MCH: 26.1 pg (ref 26.0–34.0)
MCHC: 32.2 g/dL (ref 30.0–36.0)
MCV: 80.9 fL (ref 80.0–100.0)
Platelets: 277 K/uL (ref 150–400)
RBC: 4.45 MIL/uL (ref 3.87–5.11)
RDW: 17.3 % — ABNORMAL HIGH (ref 11.5–15.5)
WBC: 3.6 K/uL — ABNORMAL LOW (ref 4.0–10.5)
nRBC: 0 % (ref 0.0–0.2)

## 2024-08-12 LAB — ETHANOL: Alcohol, Ethyl (B): <15 mg/dL (ref <15)

## 2024-08-12 LAB — HIV ANTIBODY (ROUTINE TESTING W REFLEX): HIV Screen 4th Generation wRfx: NONREACTIVE

## 2024-08-12 LAB — PREGNANCY, URINE: Preg Test, Ur: NEGATIVE

## 2024-08-12 MED ORDER — ALUM & MAG HYDROXIDE-SIMETH 200-200-20 MG/5ML PO SUSP
30.0000 mL | ORAL | Status: DC | PRN
  Administered 2024-08-30: 30 mL via ORAL
  Filled 2024-08-12: qty 30

## 2024-08-12 MED ORDER — DIPHENHYDRAMINE HCL 25 MG PO CAPS
25.0000 mg | ORAL_CAPSULE | Freq: Once | ORAL | Status: AC
  Administered 2024-08-12: 25 mg via ORAL
  Filled 2024-08-12: qty 1

## 2024-08-12 MED ORDER — DIPHENHYDRAMINE HCL 25 MG PO CAPS
50.0000 mg | ORAL_CAPSULE | Freq: Three times a day (TID) | ORAL | Status: DC | PRN
  Administered 2024-08-19: 50 mg via ORAL
  Filled 2024-08-12: qty 2

## 2024-08-12 MED ORDER — DIPHENHYDRAMINE HCL 50 MG/ML IJ SOLN
50.0000 mg | Freq: Three times a day (TID) | INTRAMUSCULAR | Status: DC | PRN
  Filled 2024-08-12 (×3): qty 1

## 2024-08-12 MED ORDER — BENZTROPINE MESYLATE 0.5 MG PO TABS
0.5000 mg | ORAL_TABLET | Freq: Two times a day (BID) | ORAL | Status: DC | PRN
  Administered 2024-08-13 (×2): 0.5 mg via ORAL
  Filled 2024-08-12 (×2): qty 1

## 2024-08-12 MED ORDER — HYDROXYZINE HCL 25 MG PO TABS: 25.0000 mg | ORAL_TABLET | Freq: Three times a day (TID) | ORAL | Status: DC | PRN

## 2024-08-12 MED ORDER — NICOTINE POLACRILEX 2 MG MT GUM
2.0000 mg | CHEWING_GUM | OROMUCOSAL | Status: DC | PRN
  Filled 2024-08-12: qty 1

## 2024-08-12 MED ORDER — HALOPERIDOL LACTATE 5 MG/ML IJ SOLN: 10.0000 mg | Freq: Three times a day (TID) | INTRAMUSCULAR | Status: DC | PRN

## 2024-08-12 MED ORDER — HYDROXYZINE HCL 25 MG PO TABS
25.0000 mg | ORAL_TABLET | Freq: Three times a day (TID) | ORAL | Status: DC | PRN
  Administered 2024-08-13 – 2024-09-01 (×16): 25 mg via ORAL
  Filled 2024-08-12 (×3): qty 1
  Filled 2024-08-12: qty 10
  Filled 2024-08-12 (×17): qty 1

## 2024-08-12 MED ORDER — NICOTINE POLACRILEX 2 MG MT GUM
2.0000 mg | CHEWING_GUM | OROMUCOSAL | Status: DC | PRN
  Administered 2024-08-13 – 2024-09-03 (×89): 2 mg via ORAL
  Filled 2024-08-12 (×47): qty 1

## 2024-08-12 MED ORDER — MAGNESIUM HYDROXIDE 400 MG/5ML PO SUSP
30.0000 mL | Freq: Every day | ORAL | Status: DC | PRN
  Administered 2024-08-13 – 2024-08-29 (×4): 30 mL via ORAL
  Filled 2024-08-12 (×4): qty 30

## 2024-08-12 MED ORDER — BENZTROPINE MESYLATE 0.5 MG PO TABS: 0.5000 mg | ORAL_TABLET | Freq: Every day | ORAL | Status: DC

## 2024-08-12 MED ORDER — LORAZEPAM 2 MG/ML IJ SOLN: 2.0000 mg | Freq: Three times a day (TID) | INTRAMUSCULAR | Status: DC | PRN

## 2024-08-12 MED ORDER — HALOPERIDOL 5 MG PO TABS
5.0000 mg | ORAL_TABLET | Freq: Three times a day (TID) | ORAL | Status: DC | PRN
  Administered 2024-08-19: 5 mg via ORAL
  Filled 2024-08-12: qty 1

## 2024-08-12 MED ORDER — HALOPERIDOL LACTATE 5 MG/ML IJ SOLN: 5.0000 mg | Freq: Three times a day (TID) | INTRAMUSCULAR | Status: DC | PRN

## 2024-08-12 MED ORDER — DIPHENHYDRAMINE HCL 50 MG/ML IJ SOLN
50.0000 mg | Freq: Three times a day (TID) | INTRAMUSCULAR | Status: DC | PRN
  Filled 2024-08-12: qty 1

## 2024-08-12 MED ORDER — BENZTROPINE MESYLATE 0.5 MG PO TABS
0.5000 mg | ORAL_TABLET | Freq: Two times a day (BID) | ORAL | Status: DC | PRN
  Filled 2024-08-12: qty 1

## 2024-08-12 MED ORDER — ACETAMINOPHEN 325 MG PO TABS
650.0000 mg | ORAL_TABLET | Freq: Four times a day (QID) | ORAL | Status: DC | PRN
  Administered 2024-08-13 – 2024-08-15 (×5): 650 mg via ORAL
  Filled 2024-08-12 (×5): qty 2

## 2024-08-12 NOTE — ED Provider Notes (Signed)
 Patient transferred from another facility for admission to [facility] as initially we were told the bed would not be ready until likely tomorrow or later.  I assessed patient she is resting comfortably without any respiratory distress.  I was told by nursing that she now has a ready bed and is able to go there now.  Will defer further medication changes to the psychiatric team who will admit.  Will flip her disposition to admit to [facility] at this time   Clinical Impression: 1. Suicidal ideation     Disposition: Admit  This note was prepared with assistance of Dragon voice recognition software. Occasional wrong-word or sound-a-like substitutions may have occurred due to the inherent limitations of voice recognition software.     Einar Nolasco, Lonni PARAS, MD 08/12/24 [phone]

## 2024-08-12 NOTE — ED Notes (Addendum)
 Personal belongings: one navy blue jacket, one white t-shirt, one bra, one pair of underwear, one pair of black sweat pants, one pair of gray socks, one pair of pink crocs    Pt's gold ring given to sister to lock in car.   All Patients belongings in (1) bag with patient label given to safe transport

## 2024-08-12 NOTE — ED Provider Notes (Signed)
 [place] EMERGENCY DEPARTMENT AT [facility] Provider Note   CSN: [id number] Arrival date & time: 08/12/24  9052     Patient presents with: Suicidal   Nancy Howell is a 48 y.o. female patient with schizoaffective schizophrenia and bipolar 1 disorder and cocaine abuse who presents to the emergency department with suicidal ideations.  Patient uses crack cocaine every single day.  Patient was homeless up in [place] .  Family at bedside provides most the history.  They drove up to grab her and bring her back to [place] .  Patient has been without her medications.  Last used cocaine yesterday.  Patient does endorse some suicidal ideations but without any specific plan.  She denies any auditory or visual hallucinations or homicidal ideations at this time.   HPI     Prior to Admission medications   Medication Sig Start Date End Date Taking? Authorizing Provider  benztropine (COGENTIN) 0.5 MG tablet Take 0.5 mg by mouth 2 (two) times daily as needed for tremors.    [provider]  divalproex (DEPAKOTE) 500 MG DR tablet Take 500 mg by mouth at bedtime.    [provider]  haloperidol  (HALDOL ) 5 MG tablet Take 5 mg by mouth at bedtime.    [provider]  haloperidol  decanoate (HALDOL  DECANOATE) 100 MG/ML injection Inject 100 mg into the muscle every 28 (twenty-eight) days.    [provider]  naproxen  (NAPROSYN ) 500 MG tablet Take 1 tablet (500 mg total) by mouth 2 (two) times daily with a meal. 11/04/16   Dreama Longs, MD    Allergies: Banana and Penicillins    Review of Systems  All other systems reviewed and are negative.   Updated Vital Signs BP 113/64 (BP Location: Left Arm)   Pulse 87   Temp 97.6 F (36.4 C) (Oral)   Resp 14   SpO2 100%   Physical Exam Vitals and nursing note reviewed.  Constitutional:      General: She is not in acute distress.    Appearance: Normal appearance.  HENT:     Head: Normocephalic and  atraumatic.  Eyes:     General:        Right eye: No discharge.        Left eye: No discharge.  Cardiovascular:     Comments: Regular rate and rhythm.  S1/S2 are distinct without any evidence of murmur, rubs, or gallops.  Radial pulses are 2+ bilaterally.  Dorsalis pedis pulses are 2+ bilaterally.  No evidence of pedal edema. Pulmonary:     Comments: Clear to auscultation bilaterally.  Normal effort.  No respiratory distress.  No evidence of wheezes, rales, or rhonchi heard throughout. Abdominal:     General: Abdomen is flat. Bowel sounds are normal. There is no distension.     Tenderness: There is no abdominal tenderness. There is no guarding or rebound.  Musculoskeletal:        General: Normal range of motion.     Cervical back: Neck supple.  Skin:    General: Skin is warm and dry.     Findings: No rash.  Neurological:     General: No focal deficit present.     Mental Status: She is alert.  Psychiatric:        Mood and Affect: Mood normal.        Behavior: Behavior normal.     (all labs ordered are listed, but only abnormal results are displayed) Labs Reviewed  COMPREHENSIVE METABOLIC PANEL WITH  GFR - Abnormal; Notable for the following components:      Result Value   Glucose, Bld 106 (*)    All other components within normal limits  CBC - Abnormal; Notable for the following components:   WBC 3.6 (*)    Hemoglobin 11.6 (*)    RDW 17.3 (*)    All other components within normal limits  URINE DRUG SCREEN - Abnormal; Notable for the following components:   Cocaine POSITIVE (*)    All other components within normal limits  ETHANOL  PREGNANCY, URINE  HIV ANTIBODY (ROUTINE TESTING W REFLEX)    EKG: EKG Interpretation Date/Time:  Tuesday August 12 2024 09:54:40 EDT Ventricular Rate:  104 PR Interval:    QRS Duration:  76 QT Interval:  386 QTC Calculation: 508 R Axis:   35  Text Interpretation: Sinus tachycardia Abnormal R-wave progression, early transition  Nonspecific T abnrm, anterolateral leads Borderline prolonged QT interval Confirmed by Jerrol Agent ([id number]) on 08/12/2024 11:10:18 AM  Radiology: No results found.   Procedures   Medications Ordered in the ED  benztropine (COGENTIN) tablet 0.5 mg (has no administration in time range)  hydrOXYzine (ATARAX) tablet 25 mg (has no administration in time range)  nicotine polacrilex (NICORETTE) gum 2 mg (has no administration in time range)  diphenhydrAMINE (BENADRYL) capsule 25 mg (25 mg Oral Given 08/12/24 1628)    Clinical Course as of 08/12/24 1916  Tue Aug 12, 2024  1554 Patient will need to be held here per psych for observation overnight.  Due to the wait time, and lack of appropriate care we can provide the patient here we will likely do an ED to ED transfer at [facility].  Dr. Cleotis to accept. [CF]    Clinical Course User Index [CF] Theotis Cameron HERO, PA-C    Medical Decision Making Nancy Howell is a 48 y.o. female patient who presents to the emergency department today for further evaluation of suicidal ideations.  Patient is not actively homicidal.  I do feel the patient likely would benefit from further evaluation with the psychiatric team.  They are full today and she will need to be observed overnight here in the freestanding emergency department.  All of her labs are otherwise normal.  Given the long wait time currently here we discussed transferring her to [facility] emergency department where she can be formally evaluated by psych at the bedside and further manage there.  Patient agreeable with plan.  I spoke with Dr. Ginger ED physician who accepts patient. She is stable for transfer at this time. Home medications ordered.    Amount and/or Complexity of Data Reviewed Labs: ordered.  Risk Decision regarding hospitalization.     Final diagnoses:  Suicidal ideation    ED Discharge Orders     None          Theotis Cameron HERO, [place] 08/12/24 1916    Jerrol Agent, MD 08/13/24 [phone]

## 2024-08-12 NOTE — ED Notes (Signed)
 Patients sister left to return after picking up child at school

## 2024-08-12 NOTE — Progress Notes (Signed)
 Pt has been accepted to [facility] on 08/12/2024 . Bed assignment:405-1   Pt meets inpatient criteria per Cathaleen Adam, NP   Attending Physician will be Dr. Prentis  Report can be called to: - Adult unit: [phone]  Pt can arrive after pending labs   Care Team Notified: [facility] Cherylynn Ernst, RN, Damien Fireman, RN, Cathaleen Adam, NP

## 2024-08-12 NOTE — ED Notes (Signed)
 Called [facility] to take patient to [facility] ED.

## 2024-08-12 NOTE — ED Notes (Signed)
 Called report to Kalen Burwell, RN at [facility]. Patient will be going to room 405-1. [facility] has been called for transport. Left number with RN in case he had any questions.

## 2024-08-12 NOTE — Consult Note (Cosign Needed Addendum)
 [facility] Psychiatric Consult Initial  Patient Name: .Nancy Howell  MRN: [id number]  DOB: 12/27/1975  Consult Order details:  Orders (From admission, onward)     Start     Ordered   08/12/24 1028  CONSULT TO CALL ACT TEAM       Ordering Provider: Theotis Cameron HERO, PA-C  Provider:  (Not yet assigned)  Question:  Reason for Consult?  Answer:  suicidal   08/12/24 1027             Mode of Visit: In person    Psychiatry Consult Evaluation  Service Date: August 12, 2024 LOS:  LOS: 0 days  Chief Complaint 48 year old female presenting to the emergency department with suicidal ideation.  Primary Psychiatric Diagnoses  Schizoaffective disorder 2.  Cocaine use disorder, severe  Assessment  Nancy Howell is a 48 y.o. female admitted: Presented to the EDfor 08/12/2024 10:13 AM for suicidal ideation and detox. She carries the psychiatric diagnoses of schizoaffective disorder and has a past medical history of none.   48 year old female with schizoaffective disorder, bipolar I disorder, and cocaine use disorder presents with active suicidal ideation, recent crack cocaine use, and poor coping resources. She has a history of multiple psychiatric hospitalizations, poor social functioning, homelessness, and ongoing substance abuse. Current presentation is complicated by medication side effects and lack of stable outpatient psychiatric follow-up in [place] . She is at high risk for self-harm given psychiatric instability, daily substance use, poor insight, and lack of independent supports. Please see plan below for detailed recommendations.   Diagnoses:  Active Hospital problems: Principal Problem:   Substance induced mood disorder (HCC) Active Problems:   GAD (generalized anxiety disorder)    Plan   ## Psychiatric Medication Recommendations:  Give Atarax 25 mg p.o. 3 times daily as needed for anxiety Give Nicorette gum 2 mg while awake for nicotine dependence  ## Medical Decision  Making Capacity: Not specifically addressed in this encounter  ## Further Work-up:  -- No further workup needed at this time EKG, While pt on Qtc prolonging medications, please monitor & replete K+ to 4 and Mg2+ to 2, or UDS -- most recent EKG on 08/12/2024 had QtC of 508; will order updated EKG due to prolonged QTc -- Pertinent labwork reviewed earlier this admission includes: CBC, CMP, EKG, UDS   ## Disposition:-- We recommend inpatient psychiatric hospitalization when medically cleared. Patient is under voluntary admission status at this time; please IVC if attempts to leave hospital.  ## Behavioral / Environmental: -To minimize splitting of staff, assign one staff person to communicate all information from the team when feasible. or Utilize compassion and acknowledge the patient's experiences while setting clear and realistic expectations for care.    ## Safety and Observation Level:  - Based on my clinical evaluation, I estimate the patient to be at moderate risk of self harm in the current setting. - At this time, we recommend  routine. This decision is based on my review of the chart including patient's history and current presentation, interview of the patient, mental status examination, and consideration of suicide risk including evaluating suicidal ideation, plan, intent, suicidal or self-harm behaviors, risk factors, and protective factors. This judgment is based on our ability to directly address suicide risk, implement suicide prevention strategies, and develop a safety plan while the patient is in the clinical setting. Please contact our team if there is a concern that risk level has changed.  CSSR Risk Category:C-SSRS RISK CATEGORY: Low Risk  Suicide Risk  Assessment: Patient has following modifiable risk factors for suicide: active suicidal ideation, untreated depression, recklessness, and active mental illness (to encompass adhd, tbi, mania, psychosis, trauma reaction), which we  are addressing by recommending inpatient psychiatric admission. Patient has following non-modifiable or demographic risk factors for suicide: psychiatric hospitalization Patient has the following protective factors against suicide: Supportive family  Thank you for this consult request. Recommendations have been communicated to the primary team.  We will continue to follow patient at this time.   Nancy Howell, PMHNP       History of Present Illness  Relevant Aspects of Hospital ED Course:  Admitted on 08/12/2024 for suicidal ideation and detox.  Patient Report:  Patient with a psychiatric history of schizoaffective disorder, bipolar I disorder, and cocaine use disorder presents with daily crack cocaine use and current suicidal thoughts. She denies homicidal ideation, auditory or visual hallucinations, but endorses ongoing paranoia and nonsensical thoughts.  Patient was evaluated via telepsychiatry in the ED. Her sister, Arland Essex, accompanied her and remained in the room with patient's consent. Patient appeared drowsy during the interview and reported recent Invega injection (one week ago) with side effects of drowsiness and involuntary tongue movements toward the roof of her mouth. She reports taking Cogentin 2 mg BID.  Patient receives psychiatric care through a psychiatrist in [place]  via telehealth. She has a history of homelessness in [place]  and longstanding crack cocaine abuse. Per sister, patient has been living on the streets in [place]  for years. During the drive back to [place]  yesterday, patient reportedly used crack cocaine in the car and was making paranoid, nonsensical statements, including fears of the mafia poisoning her food and talking about death.  Collateral history from her sister reveals multiple past inpatient psychiatric admissions, including an admission in 2012 in [place]  for psychosis and suicidal ideation. Patient has no current  employment, no disability income, and smokes approximately half a pack of cigarettes daily. Sister is supportive, has been attempting to help reinstate disability benefits, and plans for patient to live with her after discharge.   Psych ROS:  Depression: Endorses Anxiety:  endorses Mania (lifetime and current): Denies  Psychosis: (lifetime and current): Positive  Collateral information:  See HPI  Review of Systems  Psychiatric/Behavioral:  Positive for depression, substance abuse and suicidal ideas.      Psychiatric and Social History  Psychiatric History:  Information collected from patient's sister  Prev Dx/Sx: Schizoaffective disorder Current Psych Provider: Yes in [place]  Home Meds (current): yes Previous Med Trials: Yes Therapy: Denies  Prior Psych Hospitalization: Yes Prior Self Harm: Denies Prior Violence: Denies  Family Psych History: Yes Family Hx suicide: Denies  Social History:  Developmental Hx: Deferred Educational Hx: Did not graduate high school Occupational Hx: Unemployed Legal Hx: Denies Living Situation: Will be living with sister here in [place]  Spiritual Hx: Yes Access to weapons/lethal means: Denies  Substance History Alcohol: Yes Type of alcohol varies Last Drink over the weekend Number of drinks per day varies History of alcohol withdrawal seizures denies History of DT's denies Tobacco: Yes half a pack of cigarettes a day Illicit drugs: Yes Prescription drug abuse: Denies Rehab hx: Denies  Exam Findings  Physical Exam:  Vital Signs:  Temp:  [98.2 F (36.8 C)-98.7 F (37.1 C)] 98.7 F (37.1 C) (09/30 1431) Pulse Rate:  [87-103] 87 (09/30 1431) Resp:  [16-17] 17 (09/30 1431) BP: (104-132)/(71-109) 104/71 (09/30 1431) SpO2:  [95 %-97 %] 97 % (09/30 1431)  Blood pressure 104/71, pulse 87, temperature 98.7 F (37.1 C), temperature source Oral, resp. rate 17, SpO2 97%. There is no height or weight on file to calculate  BMI.  Physical Exam Vitals and nursing note reviewed. Exam conducted with a chaperone present.  Neurological:     Mental Status: She is alert.  Psychiatric:        Attention and Perception: She is inattentive.        Mood and Affect: Mood is depressed. Affect is blunt.        Speech: Speech is slurred.        Behavior: Behavior is slowed. Behavior is cooperative.        Thought Content: Thought content is paranoid. Thought content includes suicidal ideation.        Cognition and Memory: Cognition is impaired.        Judgment: Judgment is inappropriate.     Mental Status Exam: General Appearance: Disheveled, drowsy  Orientation:  Full (Time, Place, and Person)  Memory:  Immediate;   Fair Recent;   Fair  Concentration:  Concentration: Poor  Recall:  Fair  Attention  Poor  Eye Contact:  Fair  Speech:  Slow and Slurred  Language:  Fair  Volume:  Decreased  Mood: not good   Affect:  Congruent and Constricted  Thought Process:  Linear but with poverty of content; occasional tangentiality  Thought Content:  Endorses suicidal ideation without plan; denies homicidal ideation. Reports paranoia; denies current hallucinations.  Suicidal Thoughts:  Yes.  without intent/plan  Homicidal Thoughts:  No  Judgement:  Poor  Insight:  Lacking  Psychomotor Activity: Decreased  Akathisia:  No  Fund of Knowledge:  Fair      Assets:  Manufacturing systems engineer Desire for Improvement Financial Resources/Insurance Social Support  Cognition:  Impaired,  Moderate  ADL's:  Impaired  AIMS (if indicated):        Other History   These have been pulled in through the EMR, reviewed, and updated if appropriate.  Family History:  The patient's family history is not on file.  Medical History: Past Medical History:  Diagnosis Date   Bipolar 1 disorder (HCC)    Schizo affective schizophrenia [facility])     Surgical History: History reviewed. No pertinent surgical history.   Medications:  No  current facility-administered medications for this encounter.  Current Outpatient Medications:    benztropine (COGENTIN) 0.5 MG tablet, Take 0.5 mg by mouth 2 (two) times daily as needed for tremors., Disp: , Rfl:    divalproex (DEPAKOTE) 500 MG DR tablet, Take 500 mg by mouth at bedtime., Disp: , Rfl:    haloperidol  (HALDOL ) 5 MG tablet, Take 5 mg by mouth at bedtime., Disp: , Rfl:    haloperidol  decanoate (HALDOL  DECANOATE) 100 MG/ML injection, Inject 100 mg into the muscle every 28 (twenty-eight) days., Disp: , Rfl:    naproxen  (NAPROSYN ) 500 MG tablet, Take 1 tablet (500 mg total) by mouth 2 (two) times daily with a meal., Disp: 30 tablet, Rfl: 0  Allergies: Allergies  Allergen Reactions   Banana Other (See Comments)    Chest pains   Penicillins Hives and Swelling    Has patient had a PCN reaction causing immediate rash, facial/tongue/throat swelling, SOB or lightheadedness with hypotension: yes Has patient had a PCN reaction causing severe rash involving mucus membranes or skin necrosis: unknown Has patient had a PCN reaction that required hospitalization: yes, visit Has patient had a PCN reaction occurring within the last 10 years:  yes If all of the above answers are NO, then may proceed with Cephalosporin use.     Bryndan Bilyk MOTLEY-MANGRUM, PMHNP

## 2024-08-12 NOTE — ED Notes (Signed)
 Lean Cuisine macaroni and cheese [employer]

## 2024-08-12 NOTE — ED Notes (Addendum)
 Patient went to bathroom and had a very large bowel movement Patient stated that it felt like her stomach was  cutting

## 2024-08-12 NOTE — [facility] Group Notes (Signed)
 Psychoeducational Group Note  Date:  08/12/2024 Time:  2339  Group Topic/Focus:  Wrap-Up Group:   The focus of this group is to help patients review their daily goal of treatment and discuss progress on daily workbooks.  Participation Level: Did Not Attend  Participation Quality:  Not Applicable  Affect:  Not Applicable  Cognitive:  Not Applicable  Insight:  Not Applicable  Engagement in Group: Not Applicable  Additional Comments:  The patient did not attend group this evening.   Merri Dimaano S 08/12/2024, 11:39 PM

## 2024-08-12 NOTE — ED Notes (Signed)
 TTS in progress

## 2024-08-12 NOTE — ED Notes (Signed)
 ED Provider at bedside.

## 2024-08-12 NOTE — ED Notes (Signed)
 This RN called pt's sister, Stephane Glatter, and updated her on pt's transfer to [facility] ED.

## 2024-08-12 NOTE — ED Notes (Addendum)
 Personal belongings: one navy blue jacket, one white t-shirt, one bra, one pair of underwear, one pair of black sweat pants, one pair of gray socks, one pair of pink crocs   Pt's gold ring given to sister to lock in car.   Pt's belongings locked in [facility] locker #1

## 2024-08-12 NOTE — ED Notes (Signed)
TTS complete 

## 2024-08-12 NOTE — ED Triage Notes (Signed)
 Pt sister states pt expressed SI this morning.  Pt calm and cooperative in triage   Denies current plan

## 2024-08-12 NOTE — ED Notes (Signed)
 Sister back with pt she was wanded by security before entering room

## 2024-08-12 NOTE — ED Notes (Signed)
 Lean cuisine macaroni / cheese [employer]

## 2024-08-12 NOTE — ED Notes (Addendum)
 Patients sister wanded by security Patients sister's phone, keys left at charge desk

## 2024-08-12 NOTE — ED Provider Notes (Signed)
 Dr Ginger has accepted for transfer to [facility] long ED while awaiting placement.    Yolande Lamar BROCKS, MD 08/12/24 [phone]

## 2024-08-12 NOTE — ED Notes (Signed)
 Spoke with [facility] regarding TTS consult. She will follow up and reach back out on secure chat

## 2024-08-13 ENCOUNTER — Encounter (HOSPITAL_COMMUNITY): Payer: Self-pay

## 2024-08-13 DIAGNOSIS — F250 Schizoaffective disorder, bipolar type: Secondary | ICD-10-CM | POA: Diagnosis not present

## 2024-08-13 DIAGNOSIS — R45851 Suicidal ideations: Principal | ICD-10-CM

## 2024-08-13 DIAGNOSIS — F1520 Other stimulant dependence, uncomplicated: Principal | ICD-10-CM | POA: Diagnosis present

## 2024-08-13 DIAGNOSIS — F1420 Cocaine dependence, uncomplicated: Secondary | ICD-10-CM

## 2024-08-13 DIAGNOSIS — F1410 Cocaine abuse, uncomplicated: Secondary | ICD-10-CM | POA: Insufficient documentation

## 2024-08-13 MED ORDER — PALIPERIDONE ER 3 MG PO TB24
3.0000 mg | ORAL_TABLET | Freq: Every day | ORAL | Status: DC
  Administered 2024-08-13 – 2024-08-14 (×2): 3 mg via ORAL
  Filled 2024-08-13 (×2): qty 1

## 2024-08-13 MED ORDER — BENZTROPINE MESYLATE 1 MG PO TABS
2.0000 mg | ORAL_TABLET | Freq: Every day | ORAL | Status: DC
  Administered 2024-08-13 – 2024-08-14 (×2): 2 mg via ORAL
  Filled 2024-08-13 (×2): qty 2

## 2024-08-13 MED ORDER — ENSURE PLUS HIGH PROTEIN PO LIQD
237.0000 mL | Freq: Two times a day (BID) | ORAL | Status: DC
  Administered 2024-08-13: 237 mL via ORAL
  Filled 2024-08-13 (×4): qty 237

## 2024-08-13 NOTE — H&P (Addendum)
 Psychiatric Admission Assessment Adult  Patient Identification: Nancy Howell MRN:  [id number] Date of Evaluation:  08/13/2024 Chief Complaint:  Schizoaffective disorder (HCC) [F25.9] Principal Diagnosis: Suicidal ideation Diagnosis:  Principal Problem:   Suicidal ideation Active Problems:   Schizoaffective disorder (HCC)   Cocaine use disorder (HCC)    History of Present Illness: Nancy Howell is a 48 year old female with a past psychiatric history of schizoaffective disorder bipolar type, GAD and cocaine use disorder with recent cocaine use who presented to ED on 09/30 for suicidal ideation.  She is currently admitted on a voluntary basis to the behavioral health hospital.  On interview today, the patient initially discusses appropriate topics.  Eventually she does discuss delusional thought content about evil spirits and her father making clones of her when she was younger.  She is amenable to redirection.  She does not appear to be responding to internal stimuli.  During today's interview, she denies any current or recent suicidal ideation, noting that she needed to get inpatient treatment for her schizophrenia and her substance use. She reports recently moving back to [place] from [place]  to start a new life with her sister, with whom she'll be living. Back in [place] , she was unhoused and regularly using crack cocaine, and not following up with any medical provider or treatment. She last used crack on the car ride to [place] . He sister, who drove to [place] to pick her up, noted she was high after a rest stop, confiscated her paraphernalia and discarded it. She denies any other substance use at this time. She notes her mood, appetite and sleep are good. When asked about hallucinations, she denies any current AVH, but believes she can see and hear spirits, and describes previous episodes of seeing and hearing spirits who are sometimes evil and move objects in her surroundings, including one  episode yesterday at the ED. She reports a history of paranoid delusions, but denies any delusional symptoms at this time. However, she also believes that she has HIV and cancer, described first as leukemia and later as breast cancer, for which she received chemotherapy while incarcerated in 2014. She also states her father mixed medication with her drugs which she would smoke and inject. Of note, there is no record of a cancer diagnosis in her EMR, had a normal CBC, and she has a history of reporting medical issues inconsistent with exam findings (In 2012, she reported history of a stroke with enduring left sided weakness, but was neurologically intact with a normal head CT). She is aware of a non-reactive HIV antibody test, but believes the disease in her body is undetectable. 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   She reports prior diagnoses of schizophrenia, bipolar I, and depression. She reports a recent Invega injection, but the timeline is unclear as she stated she received it last 3 months ago, and later said it was actually last week, but denies seeing any medical providers, psychiatrists or therapists in several years. She has previously been treated with Zyprexa, Depakote and Haldol , which she reports have not worked well and/or had too many side effects. She also states she was on 2 mg of Cogentin, which she would like to restart for medication side effects.She reports one previous instance of self-harm and a suicide attempt precipitated by the murder of her mother and evil spirits commanding her to kill herself. She denies any history of violence against others.    She expresses concerns for  her physical and mental health and a desire to change and improve her living conditions. She finds a strong support system in her sister and her family, who live in [place] and [address]   Attempted to speak to sister, [name], number listed in the chart.  No answer.   Mailbox full, unable to leave voicemail.    Total Time spent with patient: 45 min  Past Psychiatric History: Schizoaffective disorder, Bipolar I Disorder, Substance0induce mood disorder, GAD  Is the patient at risk to self? No Has the patient been a risk to self in the past 6 months? Yes Has the patient been a risk to self within the distant past? No Is the patient a risk to others? No Has the patient been a risk to others in the past 6 months? No Has the patient been a risk to others within the distant past? No  [place] Scale:  Flowsheet Row [facility] ([facility] Most recent reading at 08/12/2024  9:00 PM ED from 08/12/2024 in [facility] Emergency Department at [facility] Most recent reading at 08/12/2024  9:54 AM  C-SSRS RISK CATEGORY Low Risk Low Risk     Prior Inpatient Therapy: yes Prior Outpatient Therapy: yes  Alcohol Screening:  Patient refused Alcohol Screening Tool: Yes 1. How often do you have a drink containing alcohol?: Never 2. How many drinks containing alcohol do you have on a typical day when you are drinking?: 1 or 2 3. How often do you have six or more drinks on one occasion?: Never AUDIT-C Score: 0 4. How often during the last year have you found that you were not able to stop drinking once you had started?: Never 5. How often during the last year have you failed to do what was normally expected from you because of drinking?: Never 6. How often during the last year have you needed a first drink in the morning to get yourself going after a heavy drinking session?: Never 7. How often during the last year have you had a feeling of guilt of remorse after drinking?: Never 8. How often during the last year have you been unable to remember what happened the night before because you had been drinking?: Never 9. Have you or someone else been injured as a result of your drinking?: No 10. Has a  relative or friend or a doctor or another health worker been concerned about your drinking or suggested you cut down?: No Alcohol Use Disorder Identification Test Final Score (AUDIT): 0 Alcohol Brief Interventions/Follow-up: Patient Refused Substance Abuse History in the last 12 months:  yes Consequences of Substance Abuse: negative Previous Psychotropic Medications: yes Psychological Evaluations: yes Past Medical History:  Past Medical History:  Diagnosis Date   Bipolar 1 disorder (HCC)    Schizo affective schizophrenia (HCC)     History reviewed. No pertinent surgical history. Family History:  History reviewed. No pertinent family history. Family Psychiatric  History: none pertinent Tobacco Screening:  Social History   Tobacco Use  Smoking Status Every Day   Current packs/day: 0.50   Types: Cigarettes  Smokeless Tobacco Never    BH Tobacco Counseling     Are you interested in Tobacco Cessation Medications?  Yes, she is interest in 4 mg nicotine gum Counseled patient on smoking cessation: Yes, she is motivated and attempting to quit. Reason Tobacco Screening Not Completed: N/A       Social History:  Social History  Substance and Sexual Activity  Alcohol Use Yes   Comment: socially     Social History   Substance and Sexual Activity  Drug Use No    Additional Social History:  She in unmarried and has no children. She reports having completed 10th grade, but then dropping out of school due to falling in with a bad crowd. She reports some moving back and forth between [place] and [place]. She denies any childhood trauma and reports a happy childhood in [place]. She says her father died of some cancer in 46, and her mother was murdered in 2010. She was previously unhoused in [place] . She will be living in [place] with her sister, Stephane, her sister's girlfriend, and her 2 kids. It is unclear if the patient is adopted, she mentioned living with an adoptive family but was  unable to clarify further. She denies any legal issues or access to firearms.    Allergies:   Allergies  Allergen Reactions   Banana Other (See Comments)    Chest pains   Penicillins Hives, Swelling and Other (See Comments)    Has patient had a PCN reaction causing immediate rash, facial/tongue/throat swelling, SOB or lightheadedness with hypotension: yes Has patient had a PCN reaction causing severe rash involving mucus membranes or skin necrosis: unknown Has patient had a PCN reaction that required hospitalization: yes, visit Has patient had a PCN reaction occurring within the last 10 years: yes If all of the above answers are NO, then may proceed with Cephalosporin use.    Lab Results:  Results for orders placed or performed during the hospital encounter of 08/12/24 (from the past 48 hours)  Rapid urine drug screen (hospital performed)     Status: Abnormal   Collection Time: 08/12/24  9:57 AM  Result Value Ref Range   Opiates NEGATIVE NEGATIVE   Cocaine POSITIVE (A) NEGATIVE   Benzodiazepines NEGATIVE NEGATIVE   Amphetamines NEGATIVE NEGATIVE   Tetrahydrocannabinol NEGATIVE NEGATIVE   Barbiturates NEGATIVE NEGATIVE   Methadone Scn, Ur NEGATIVE NEGATIVE   Fentanyl NEGATIVE NEGATIVE    Comment: (NOTE) Drug screen is for Medical Purposes only. Positive results are preliminary only. If confirmation is needed, notify lab within 5 days.  Drug Class                 Cutoff (ng/mL) Amphetamine and metabolites 1000 Barbiturate and metabolites 200 Benzodiazepine              200 Opiates and metabolites     300 Cocaine and metabolites     300 THC                         50 Fentanyl                    5 Methadone                   300  Trazodone is metabolized in vivo to several metabolites,  including pharmacologically active m-CPP, which is excreted in the  urine.  Immunoassay screens for amphetamines and MDMA have potential  cross-reactivity with these compounds and may  provide false positive  result.  Performed at [facility], [address] [place], [place] [address]   Pregnancy, urine     Status: None   Collection Time: 08/12/24  9:57 AM  Result Value Ref Range   Preg Test, Ur NEGATIVE NEGATIVE    Comment:  THE SENSITIVITY OF THIS METHODOLOGY IS >20 mIU/mL. Performed at [facility], [address] [place], [place] [address]   Comprehensive metabolic panel     Status: Abnormal   Collection Time: 08/12/24  9:58 AM  Result Value Ref Range   Sodium 140 135 - 145 mmol/L   Potassium 3.6 3.5 - 5.1 mmol/L   Chloride 106 98 - 111 mmol/L   CO2 22 22 - 32 mmol/L   Glucose, Bld 106 (H) 70 - 99 mg/dL    Comment: Glucose reference range applies only to samples taken after fasting for at least 8 hours.   BUN 6 6 - 20 mg/dL   Creatinine, Ser 9.47 0.44 - 1.00 mg/dL   Calcium 8.9 8.9 - 89.6 mg/dL   Total Protein 7.2 6.5 - 8.1 g/dL   Albumin 4.1 3.5 - 5.0 g/dL   AST 15 15 - 41 U/L   ALT 8 0 - 44 U/L   Alkaline Phosphatase 74 38 - 126 U/L   Total Bilirubin <0.2 0.0 - 1.2 mg/dL   GFR, Estimated >39 >39 mL/min    Comment: (NOTE) Calculated using the CKD-EPI Creatinine Equation (2021)    Anion gap 12 5 - 15    Comment: Performed at [facility], [address] [place], [place] [address]  Ethanol     Status: None   Collection Time: 08/12/24  9:58 AM  Result Value Ref Range   Alcohol, Ethyl (B) <15 <15 mg/dL    Comment: (NOTE) For medical purposes only. Performed at [facility], [address] [place], [place] [address]   cbc     Status: Abnormal   Collection Time: 08/12/24  9:58 AM  Result Value Ref Range   WBC 3.6 (L) 4.0 - 10.5 K/uL   RBC 4.45 3.87 - 5.11 MIL/uL   Hemoglobin 11.6 (L) 12.0 - 15.0 g/dL   HCT 63.9 63.9 - 53.9 %   MCV 80.9 80.0 - 100.0 fL   MCH 26.1 26.0 - 34.0 pg   MCHC 32.2 30.0 - 36.0 g/dL   RDW 82.6 (H) 88.4 - 84.4 %   Platelets 277 150 - 400 K/uL   nRBC 0.0 0.0 - 0.2  %    Comment: Performed at [facility], [address] [place], [place] [address]  HIV Antibody (routine testing w rflx)     Status: None   Collection Time: 08/12/24 10:51 AM  Result Value Ref Range   HIV Screen 4th Generation wRfx Non Reactive Non Reactive    Comment: Performed at [facility], [address], [place], [place] [address]    Blood Alcohol level:  Lab Results  Component Value Date   [facility] <15 08/12/2024   ETH <11 08/02/2011    Metabolic Disorder Labs:  No results found for: HGBA1C, MPG No results found for: PROLACTIN No results found for: CHOL, TRIG, HDL, CHOLHDL, VLDL, LDLCALC  Current Medications: Current Facility-Administered Medications  Medication Dose Route Frequency Provider Last Rate Last Admin   acetaminophen (TYLENOL) tablet 650 mg  650 mg Oral Q6H PRN Motley-Mangrum, Jadeka A, PMHNP       alum & mag hydroxide-simeth (MAALOX/MYLANTA) 200-200-20 MG/5ML suspension 30 mL  30 mL Oral Q4H PRN Motley-Mangrum, Jadeka A, PMHNP       benztropine (COGENTIN) tablet 0.5 mg  0.5 mg Oral BID PRN Motley-Mangrum, Jadeka A, PMHNP   0.5 mg at 08/13/24 1121   haloperidol  (HALDOL )  tablet 5 mg  5 mg Oral TID PRN Motley-Mangrum, Jadeka A, PMHNP       And   diphenhydrAMINE (BENADRYL) capsule 50 mg  50 mg Oral TID PRN Motley-Mangrum, Jadeka A, PMHNP       haloperidol  lactate (HALDOL ) injection 5 mg  5 mg Intramuscular TID PRN Motley-Mangrum, Jadeka A, PMHNP       And   diphenhydrAMINE (BENADRYL) injection 50 mg  50 mg Intramuscular TID PRN Motley-Mangrum, Jadeka A, PMHNP       And   LORazepam (ATIVAN) injection 2 mg  2 mg Intramuscular TID PRN Motley-Mangrum, Jadeka A, PMHNP       haloperidol  lactate (HALDOL ) injection 10 mg  10 mg Intramuscular TID PRN Motley-Mangrum, Jadeka A, PMHNP       And   diphenhydrAMINE (BENADRYL) injection 50 mg  50 mg Intramuscular TID PRN Motley-Mangrum, Jadeka A, PMHNP       And   LORazepam (ATIVAN) injection 2  mg  2 mg Intramuscular TID PRN Motley-Mangrum, Jadeka A, PMHNP       hydrOXYzine (ATARAX) tablet 25 mg  25 mg Oral TID PRN Motley-Mangrum, Jadeka A, PMHNP   25 mg at 08/13/24 1122   magnesium hydroxide (MILK OF MAGNESIA) suspension 30 mL  30 mL Oral Daily PRN Motley-Mangrum, Jadeka A, PMHNP       nicotine polacrilex (NICORETTE) gum 2 mg  2 mg Oral PRN Motley-Mangrum, Jadeka A, PMHNP   2 mg at 08/13/24 0750   PTA Medications: Medications Prior to Admission  Medication Sig Dispense Refill Last Dose/Taking   benztropine (COGENTIN) 2 MG tablet Take 2 mg by mouth 2 (two) times daily.   08/13/2024 Morning   diphenhydrAMINE (BENADRYL) 25 mg capsule Take 25 mg by mouth daily.   08/12/2024   Paliperidone Palmitate (INVEGA SUSTENNA IM) Inject into the muscle. Pt received at [facility] [phone] pharmcacy refused to provide dosing and administration information   Taking    Musculoskeletal: Strength & Muscle Tone: normal Gait & Station: normal Patient leans: NA   Psychiatric Specialty Exam: Physical Exam Constitutional:      Appearance: the patient is not toxic-appearing.  Pulmonary:     Effort: Pulmonary effort is normal.  Neurological:     General: No focal deficit present.     Mental Status: the patient is alert and oriented to person, place, and time.   Review of Systems  Respiratory:  Negative for shortness of breath.   Cardiovascular:  Negative for chest pain.  Gastrointestinal:  Negative for abdominal pain, constipation, diarrhea, nausea and vomiting.  Neurological:  Negative for headaches.      BP 117/84 (BP Location: Right Arm)   Pulse 95   Temp 98.4 F (36.9 C) (Oral)   Resp 16   Ht 5' 1 (1.549 m)   Wt 59 kg   SpO2 100%   BMI 24.58 kg/m   General Appearance: Alert and cooperative, clean, fairly groomed, normal gait with some lordosis, somewhat restless  Eye Contact: Good  Speech: Talkative, normal rate, clear  Volume: Low  Mood: Good  Affect: Congruent and  euthymic, normal fluctuation, slightly restricted  Thought Process: Circumstantial  Orientation: Oriented to self and place, believed it was August 2004.  Thought Content: Delusions  Suicidal Thoughts: No  Homicidal Thoughts: No  Memory: Immediate: Good  Judgement: Fair, is aware of diagnoses and need for treatment  Insight: Fair, help-seeking  Psychomotor Activity:   Concentration:   Recall: Good  Fund of Knowledge: Fair, knew  current and previous presidents  Language: Good  Akathisia: Yes: rocking back and forth on chair  Handed:    AIMS (if indicated):   Assets:  Communication Skills Desire for Improvement Financial Resources/Insurance   ADL's: Good  Cognition: Fair  Sleep: Good    Treatment Plan Summary: Daily contact with patient to assess and evaluate symptoms and progress in treatment and Medication management  Physician Treatment Plan for Primary Diagnosis: Suicidal ideation Long Term Goal(s): Improvement in symptoms so as ready for discharge  Short Term Goals: Ability to identify changes in lifestyle to reduce recurrence of condition will improve  Physician Treatment Plan for Secondary Diagnosis:  Principal Problem:   Suicidal ideation Active Problems:   Schizoaffective disorder (HCC)   Cocaine use disorder (HCC)   Long Term Goal(s): Improvement in symptoms so as ready for discharge  Short Term Goals: Ability to verbalize feelings will improve  ASSESSMENT: Mahogani Holohan is a 48 year old female with a past psychiatric history of schizoaffective disorder bipolar type, GAD and cocaine use disorder. She has a genetic predisposition for bipolar disorder, and the traumatic experience of losing her mother. She also has an extensive history of crack cocaine use and poorly controlled schizoaffective disorder with poor treatment adherence with suboptimal therapeutic response and significant side effects. She has experienced housing and financial instability. Her current  presentation of delusions is likely due to treatment non-adherence and substance use.   Diagnoses / Active Problems: Schizoaffective disorder, cocaine use disorder   PLAN: Safety and Monitoring:  -- Voluntary admission to inpatient psychiatric unit for safety, stabilization and treatment  -- Daily contact with patient to assess and evaluate symptoms and progress in treatment  -- Patient's case to be discussed in multi-disciplinary team meeting  -- Observation Level : q15 minute checks  -- Vital signs:  q12 hours  -- Precautions: suicide, elopement, and assault  2. Psychiatric Diagnoses and Treatment:   ##Schizoaffective Disorder  -- Start Invega 3 mg p.o. -- Need to find out last shot of Invega (from sister or pharmacy)  -- Start Cogentin 2 mg daily  #Cocaine use disorder  -- Continue to monitor for physiologic crash, suicidal ideation, and psychomotor agitation  -- PRN haloperidol , diphenhydramine, and lorazepam for agitation   3. Medical Issues Being Addressed:   Labs review, notable for UDS + cocaine, mild anemia, mild leukopenia, otherwise unremarkable   Tobacco Use Disorder  -- Nicotine patch 21mg /24 hours ordered  -- Smoking cessation encouraged  4. Discharge Planning:   -- Social work and case management to assist with discharge planning and identification of hospital follow-up needs prior to discharge  -- Estimated LOS: 5-7 days  -- Discharge Concerns: Need to establish a safety plan; Medication compliance and effectiveness  -- Discharge Goals: Discharge to residential rehabilitation facility one psychiatric symptoms are stabilized with referrals for mental health follow-up including medication management/psychotherapy   I certify that inpatient services furnished can reasonably be expected to improve the patient's condition.    Abel A. Cena Champion, Medical Student  I personally was present and performed or re-performed the history, physical exam and medical  decision-making activities of this service and have verified that the service and findings are accurately documented in the student's note.  Karleen Kaufmann, MD PGY-4

## 2024-08-13 NOTE — Plan of Care (Signed)
   Problem: Education: Goal: Knowledge of [facility] Education information/materials will improve Outcome: Progressing Goal: Emotional status will improve Outcome: Progressing Goal: Mental status will improve Outcome: Progressing Goal: Verbalization of understanding the information provided will improve Outcome: Progressing

## 2024-08-13 NOTE — Tx Team (Signed)
 Initial Treatment Plan 08/13/2024 5:19 AM [name] [id number]    PATIENT STRESSORS: Substance abuse     PATIENT STRENGTHS: Supportive family/friends    PATIENT IDENTIFIED PROBLEMS: Substance Abuse                     DISCHARGE CRITERIA:  Improved stabilization in mood, thinking, and/or behavior  PRELIMINARY DISCHARGE PLAN: Return to previous living arrangement  PATIENT/FAMILY INVOLVEMENT: This treatment plan has been presented to and reviewed with the patient, Nancy Howell, and/or family member, .  The patient and family have been given the opportunity to ask questions and make suggestions.  Ladona LELON Belfast, RN 08/13/2024, 5:19 AM

## 2024-08-13 NOTE — Group Note (Signed)
 Date:  08/13/2024 Time:  3:39 PM  Group Topic/Focus: Sleep Hygiene Dimensions of Wellness:   The focus of this group is to introduce the topic of wellness and discuss the role each dimension of wellness plays in total health.    Participation Level:  Did Not Attend  Nancy Howell 08/13/2024, 3:39 PM

## 2024-08-13 NOTE — BHH Suicide Risk Assessment (Signed)
  [facility] Admission Suicide Risk Assessment   Nursing information obtained from:  Patient Demographic factors:  NA Current Mental Status:  Self-harm thoughts Loss Factors:  NA Historical Factors:  Impulsivity, Prior suicide attempts Risk Reduction Factors:  Living with another person, especially a relative  Total Time spent with patient: 45 min Principal Problem: Suicidal ideation Diagnosis:  Principal Problem:   Suicidal ideation Active Problems:   Schizoaffective disorder (HCC)   Cocaine use disorder (HCC)   Subjective Data:  Nancy Howell is a 48 year old female with a past psychiatric history of schizoaffective disorder bipolar type, GAD and cocaine use disorder with recent cocaine use who presented to ED on 09/30 for suicidal ideation and detoxification.      Continued Clinical Symptoms:  Alcohol Use Disorder Identification Test Final Score (AUDIT): 0 The Alcohol Use Disorders Identification Test, Guidelines for Use in Primary Care, Second Edition.  World Science writer [facility]). Score between 0-7:  no or low risk or alcohol related problems. Score between 8-15:  moderate risk of alcohol related problems. Score between 16-19:  high risk of alcohol related problems. Score 20 or above:  warrants further diagnostic evaluation for alcohol dependence and treatment.   CLINICAL FACTORS:   Schizophrenia:   Depressive state  Psychiatric Specialty Exam: Physical Exam Constitutional:      Appearance: the patient is not toxic-appearing.  Pulmonary:     Effort: Pulmonary effort is normal.  Neurological:     General: No focal deficit present.     Mental Status: the patient is alert and oriented to person, place, and time.   Review of Systems  Respiratory:  Negative for shortness of breath.   Cardiovascular:  Negative for chest pain.  Gastrointestinal:  Negative for abdominal pain, constipation, diarrhea, nausea and vomiting.  Neurological:  Negative for headaches.      BP  117/84 (BP Location: Right Arm)   Pulse 95   Temp 98.4 F (36.9 C) (Oral)   Resp 16   Ht 5' 1 (1.549 m)   Wt 59 kg   SpO2 100%   BMI 24.58 kg/m   General Appearance: Fairly Groomed  Eye Contact:  Good  Speech:  Clear and Coherent  Volume:  Normal  Mood:  okay  Affect:  Congruent  Thought Process:  Coherent  Orientation:  Full (Time, Place, and Person)  Thought Content: Logical   Suicidal Thoughts:  No  Homicidal Thoughts:  No  Memory:  Immediate;   Good  Judgement:  poor  Insight:  poor  Psychomotor Activity:  Normal  Concentration:  Concentration: Good  Recall:  Good  Fund of Knowledge: Good  Language: Good  Akathisia:  No  Handed:  not assessed  AIMS (if indicated): not done  Assets:  Communication Skills Desire for Improvement Financial Resources/Insurance Housing Leisure Time Physical Health  ADL's:  Intact  Cognition: WNL  Sleep:  Fair      COGNITIVE FEATURES THAT CONTRIBUTE TO RISK:  Loss of executive function    SUICIDE RISK:  Moderate: Frequent suicidal ideation with limited intensity, and duration, some specificity in terms of plans, no associated intent, good self-control, limited dysphoria/symptomatology, some risk factors present, and identifiable protective factors, including available and accessible social support.  PLAN OF CARE: See H and P  I certify that inpatient services furnished can reasonably be expected to improve the patient's condition.   Karleen Kaufmann, MD PGY-4

## 2024-08-13 NOTE — Progress Notes (Addendum)
 Admission Note: report received from Kiristin RN patient is a 69 VOL year old female from [facility] Pt was calm and cooperative during assessment. endorses passive SI. Admission plan of care reviewed with pt, consent signed.  Personal belongings/skin assessment completed. No contraband found.  Patient oriented to the unit, staff and room.  Routine safety checks initiated.  Verbalizes understanding of unit rules/protocols.   Patient is presently safe on the unit. No unsafe behaviors noted.  Q 15 minute safety checks maintained per unit protocol.

## 2024-08-13 NOTE — Progress Notes (Signed)
 Tour of Duty:  Prentice JINNY Angle, RN, 08/13/24, Tour of Duty: 0700-1900  SI/HI/AVH: Denies  Self-Reported   Mood: Negative  Anxiety: Endorses Depression: Denies Irritability: Denies, but Observable  Broset  Violence Prevention Guidelines *See Row Information*: Moderate Violence Risk interventions implemented   LBM  Last BM Date : 08/11/24   Pain: present, PRN provided (see MAR)  Patient Refusals (including Rx): No  >>Shift Summary: Patient observed to be anxious and needy on unit. Patient able to make needs known. Patient noted to be manipulative and childlike interacting with nurse. Frequently seeks staff for persistent PRN Rx. Patient observed to engage inappropriately with staff and peers. Patient taking medications as prescribed. This shift, PRN medication requested with great frequency. No reported or observed side effects to medication. No reported or observed agitation, aggression, or other acute emotional distress. Patient appears to have preformative tremors in hands when requesting Rx, but no tremors noted under observation. No reported or observed physical abnormalities or concerns.  Last Vitals  Vitals Weight: 59 kg Temp: 98.4 F (36.9 C) Temp Source: Oral Pulse Rate: 92 Resp: 16 BP: 116/79 Patient Position: (not recorded)  Admission Type  Psych Admission Type (Psych Patients Only) Admission Status: Voluntary Date 72 hour document signed : (not recorded) Time 72 hour document signed : (not recorded) Provider Notified (First and Last Name) (see details for LINK to note): (not recorded)   Psychosocial Assessment  Psychosocial Assessment Patient Complaints: Anxiety, Worrying, Restlessness Eye Contact: Brief Facial Expression: Anxious Affect: Anxious Speech: Rapid Interaction: Childlike, Manipulative, Needy Motor Activity: Fidgety, Tremors Appearance/Hygiene: Unremarkable Behavior Characteristics: Anxious, Fidgety Mood: Anxious, Preoccupied    Aggressive Behavior  Targets: (not recorded)   Thought Process  Thought Process Coherency: Circumstantial Content: Preoccupation Delusions: Somatic Perception: Derealization Hallucination: None reported or observed Judgment: Poor Confusion: None  Danger to Self/Others  Danger to Self Current suicidal ideation?: Denies Description of Suicide Plan: (not recorded) Self-Injurious Behavior: (not recorded) Agreement Not to Harm Self: (not recorded) Description of Agreement: (not recorded) Danger to Others: None reported or observed

## 2024-08-13 NOTE — Progress Notes (Signed)

## 2024-08-13 NOTE — Plan of Care (Signed)
   Problem: Education: Goal: Emotional status will improve Outcome: Not Progressing Goal: Mental status will improve Outcome: Not Progressing

## 2024-08-13 NOTE — BH IP Treatment Plan (Signed)
 Interdisciplinary Treatment and Diagnostic Plan Update  08/13/2024 Time of Session: 1020AM Nancy Howell MRN: [id number]  Principal Diagnosis: Suicidal ideation  Secondary Diagnoses: Principal Problem:   Suicidal ideation Active Problems:   Schizoaffective disorder (HCC)   Current Medications:  Current Facility-Administered Medications  Medication Dose Route Frequency Provider Last Rate Last Admin   acetaminophen (TYLENOL) tablet 650 mg  650 mg Oral Q6H PRN Motley-Mangrum, Jadeka A, PMHNP       alum & mag hydroxide-simeth (MAALOX/MYLANTA) 200-200-20 MG/5ML suspension 30 mL  30 mL Oral Q4H PRN Motley-Mangrum, Jadeka A, PMHNP       benztropine (COGENTIN) tablet 0.5 mg  0.5 mg Oral BID PRN Motley-Mangrum, Jadeka A, PMHNP   0.5 mg at 08/13/24 0615   haloperidol  (HALDOL ) tablet 5 mg  5 mg Oral TID PRN Motley-Mangrum, Jadeka A, PMHNP       And   diphenhydrAMINE (BENADRYL) capsule 50 mg  50 mg Oral TID PRN Motley-Mangrum, Jadeka A, PMHNP       haloperidol  lactate (HALDOL ) injection 5 mg  5 mg Intramuscular TID PRN Motley-Mangrum, Jadeka A, PMHNP       And   diphenhydrAMINE (BENADRYL) injection 50 mg  50 mg Intramuscular TID PRN Motley-Mangrum, Jadeka A, PMHNP       And   LORazepam (ATIVAN) injection 2 mg  2 mg Intramuscular TID PRN Motley-Mangrum, Jadeka A, PMHNP       haloperidol  lactate (HALDOL ) injection 10 mg  10 mg Intramuscular TID PRN Motley-Mangrum, Jadeka A, PMHNP       And   diphenhydrAMINE (BENADRYL) injection 50 mg  50 mg Intramuscular TID PRN Motley-Mangrum, Jadeka A, PMHNP       And   LORazepam (ATIVAN) injection 2 mg  2 mg Intramuscular TID PRN Motley-Mangrum, Jadeka A, PMHNP       hydrOXYzine (ATARAX) tablet 25 mg  25 mg Oral TID PRN Motley-Mangrum, Jadeka A, PMHNP       magnesium hydroxide (MILK OF MAGNESIA) suspension 30 mL  30 mL Oral Daily PRN Motley-Mangrum, Jadeka A, PMHNP       nicotine polacrilex (NICORETTE) gum 2 mg  2 mg Oral PRN Motley-Mangrum, Jadeka A, PMHNP   2 mg  at 08/13/24 0750   PTA Medications: Medications Prior to Admission  Medication Sig Dispense Refill Last Dose/Taking   benztropine (COGENTIN) 2 MG tablet Take 2 mg by mouth 2 (two) times daily.   08/13/2024 Morning   diphenhydrAMINE (BENADRYL) 25 mg capsule Take 25 mg by mouth daily.   08/12/2024   Paliperidone Palmitate (INVEGA SUSTENNA IM) Inject into the muscle. Pt received at [facility] [phone] pharmcacy refused to provide dosing and administration information   Taking    Patient Stressors: Substance abuse    Patient Strengths: Supportive family/friends   Treatment Modalities: Medication Management, Group therapy, Case management,  1 to 1 session with clinician, Psychoeducation, Recreational therapy.   Physician Treatment Plan for Primary Diagnosis: Suicidal ideation Long Term Goal(s):     Short Term Goals:    Medication Management: Evaluate patient's response, side effects, and tolerance of medication regimen.  Therapeutic Interventions: 1 to 1 sessions, Unit Group sessions and Medication administration.  Evaluation of Outcomes: Not Progressing  Physician Treatment Plan for Secondary Diagnosis: Principal Problem:   Suicidal ideation Active Problems:   Schizoaffective disorder (HCC)  Long Term Goal(s):     Short Term Goals:       Medication Management: Evaluate patient's response, side effects, and tolerance of medication regimen.  Therapeutic Interventions:  1 to 1 sessions, Unit Group sessions and Medication administration.  Evaluation of Outcomes: Not Progressing   RN Treatment Plan for Primary Diagnosis: Suicidal ideation Long Term Goal(s): Knowledge of disease and therapeutic regimen to maintain health will improve  Short Term Goals: Ability to remain free from injury will improve, Ability to verbalize frustration and anger appropriately will improve, Ability to demonstrate self-control, Ability to participate in decision making will improve, Ability  to verbalize feelings will improve, Ability to disclose and discuss suicidal ideas, Ability to identify and develop effective coping behaviors will improve, and Compliance with prescribed medications will improve  Medication Management: RN will administer medications as ordered by provider, will assess and evaluate patient's response and provide education to patient for prescribed medication. RN will report any adverse and/or side effects to prescribing provider.  Therapeutic Interventions: 1 on 1 counseling sessions, Psychoeducation, Medication administration, Evaluate responses to treatment, Monitor vital signs and CBGs as ordered, Perform/monitor CIWA, COWS, AIMS and Fall Risk screenings as ordered, Perform wound care treatments as ordered.  Evaluation of Outcomes: Not Progressing   LCSW Treatment Plan for Primary Diagnosis: Suicidal ideation Long Term Goal(s): Safe transition to appropriate next level of care at discharge, Engage patient in therapeutic group addressing interpersonal concerns.  Short Term Goals: Engage patient in aftercare planning with referrals and resources, Increase social support, Increase ability to appropriately verbalize feelings, Increase emotional regulation, Facilitate acceptance of mental health diagnosis and concerns, Facilitate patient progression through stages of change regarding substance use diagnoses and concerns, Identify triggers associated with mental health/substance abuse issues, and Increase skills for wellness and recovery  Therapeutic Interventions: Assess for all discharge needs, 1 to 1 time with Social worker, Explore available resources and support systems, Assess for adequacy in community support network, Educate family and significant other(s) on suicide prevention, Complete Psychosocial Assessment, Interpersonal group therapy.  Evaluation of Outcomes: Not Progressing   Progress in Treatment: Attending groups: None held since  admission Participating in groups: NA Taking medication as prescribed: None prescribed since admission Toleration medication: NA Family/Significant other contact made: No, will contact:  Sister, [name] [phone] Patient understands diagnosis: Yes. Discussing patient identified problems/goals with staff: Yes. Medical problems stabilized or resolved: Yes. Denies suicidal/homicidal ideation: Yes. Issues/concerns per patient self-inventory: No.  New problem(s) identified: No, Describe:  none reported   New Short Term/Long Term Goal(s): detox, medication management for mood stabilization; elimination of SI thoughts; development of comprehensive mental wellness/sobriety plan  Patient Goals:  Figure out my SSI and possibly go to inpatient rehab  Discharge Plan or Barriers: Patient recently admitted. CSW will continue to follow and assess for appropriate referrals and possible discharge planning.    Reason for Continuation of Hospitalization: Anxiety Medication stabilization Suicidal ideation  Estimated Length of Stay: 5-7 days  Last 3 [place] Suicide Severity Risk Score: Flowsheet Row [facility] Most recent reading at 08/12/2024  9:00 PM ED from 08/12/2024 in [facility] Emergency Department at [address] Most recent reading at 08/12/2024  9:54 AM  C-SSRS RISK CATEGORY Low Risk Low Risk    Last PHQ 2/9 Scores:     No data to display          Scribe for Treatment Team: Jenkins LULLA Primer, LCSWA 08/13/2024 10:58 AM
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
## 2024-08-13 NOTE — Group Note (Signed)
 Date:  08/13/2024 Time:  11:34 AM  Group Topic/Focus:  Goals Group:   The focus of this group is to help patients establish daily goals to achieve during treatment and discuss how the patient can incorporate goal setting into their daily lives to aide in recovery.    Participation Level:  Did Not Attend  Participation Quality:  Did Not Attend  Affect:  Did Not Attend  Cognitive:  Did Not Attend  Insight: None  Engagement in Group:  Did Not Attend  Modes of Intervention:  Did Not Attend  Additional Comments:  Did Not Attend  Nancy Howell 08/13/2024, 11:34 AM

## 2024-08-13 NOTE — Group Note (Signed)
 Recreation Therapy Group Note   Group Topic:Other  Group Date: 08/13/2024 Start Time: 0930 End Time: 1000 Facilitators: Arty Lantzy-McCall, LRT,CTRS Location: [address] Dayroom   Group Topic: Communication, Problem Solving   Goal Area(s) Addresses:  Patient will effectively listen to complete activity.  Patient will identify communication skills used to make activity successful.  Patient will identify how skills used during activity can be used to reach post d/c goals.    Intervention: Building surveyor Activity - Geometric pattern cards, pencils, blank paper    Activity: Geometric Drawings.  Three volunteers from the peer group will be shown an abstract picture with a particular arrangement of geometrical shapes.  Each round, one 'speaker' will describe the pattern, as accurately as possible without revealing the image to the group.  The remaining group members will listen and draw the picture to reflect how it is described to them. Patients with the role of 'listener' cannot ask clarifying questions but, may request that the speaker repeat a direction. Once the drawings are complete, the presenter will show the rest of the group the picture and compare how close each person came to drawing the picture. LRT will facilitate a post-activity discussion regarding effective communication and the importance of planning, listening, and asking for clarification in daily interactions with others.  Education: Environmental consultant, Active listening, Support systems, Discharge planning  Education Outcome: Acknowledges understanding/In group clarification offered/Needs additional education.    Affect/Mood: N/A   Participation Level: N/A    Clinical Observations/Individualized Feedback: Recreation therapy group session did not occur due to previous going time.     Plan: Continue to engage patient in RT group sessions 2-3x/week.   Sagar Tengan-McCall, LRT,CTRS 08/13/2024 1:53 PM

## 2024-08-13 NOTE — Progress Notes (Signed)
(  Sleep Hours) - 7.75 (Any PRNs that were needed, meds refused, or side effects to meds)- none (Any disturbances and when (visitation, over night)- none (Concerns raised by the patient)- none (SI/HI/AVH)- passive SI

## 2024-08-14 DIAGNOSIS — F250 Schizoaffective disorder, bipolar type: Secondary | ICD-10-CM | POA: Diagnosis not present

## 2024-08-14 DIAGNOSIS — F1420 Cocaine dependence, uncomplicated: Secondary | ICD-10-CM | POA: Diagnosis not present

## 2024-08-14 DIAGNOSIS — R45851 Suicidal ideations: Secondary | ICD-10-CM | POA: Diagnosis not present

## 2024-08-14 MED ORDER — FLUTICASONE PROPIONATE 50 MCG/ACT NA SUSP
1.0000 | Freq: Two times a day (BID) | NASAL | Status: DC | PRN
  Administered 2024-08-15 – 2024-08-22 (×3): 1 via NASAL
  Filled 2024-08-14: qty 16

## 2024-08-14 MED ORDER — ENSURE PLUS HIGH PROTEIN PO LIQD
237.0000 mL | Freq: Two times a day (BID) | ORAL | Status: DC
  Administered 2024-08-14 – 2024-09-01 (×31): 237 mL via ORAL
  Filled 2024-08-14 (×43): qty 237

## 2024-08-14 MED ORDER — BENZTROPINE MESYLATE 1 MG PO TABS
1.0000 mg | ORAL_TABLET | Freq: Once | ORAL | Status: AC
  Administered 2024-08-14: 1 mg via ORAL
  Filled 2024-08-14: qty 1

## 2024-08-14 MED ORDER — BENZTROPINE MESYLATE 1 MG PO TABS
2.0000 mg | ORAL_TABLET | Freq: Two times a day (BID) | ORAL | Status: DC
  Administered 2024-08-14 – 2024-08-29 (×28): 2 mg via ORAL
  Filled 2024-08-14 (×4): qty 2
  Filled 2024-08-14: qty 4
  Filled 2024-08-14: qty 2
  Filled 2024-08-14 (×3): qty 4
  Filled 2024-08-14 (×2): qty 2
  Filled 2024-08-14 (×2): qty 4
  Filled 2024-08-14 (×5): qty 2
  Filled 2024-08-14 (×2): qty 4
  Filled 2024-08-14: qty 2
  Filled 2024-08-14 (×4): qty 4
  Filled 2024-08-14 (×3): qty 2
  Filled 2024-08-14: qty 4

## 2024-08-14 NOTE — Progress Notes (Signed)
 [facility] MD Progress Note  08/14/2024 3:06 PM Nancy Howell  MRN:  [id number]  Principal Problem: Suicidal ideation Diagnosis: Principal Problem:   Suicidal ideation Active Problems:   Schizoaffective disorder (HCC)   Cocaine use disorder (HCC)    Reason for Admission:  Nancy Howell is a 48 year old female with a past psychiatric history of schizoaffective disorder bipolar type, GAD, and cocaine use disorder with recent cocaine use who presented to ED on 09/30 for suicidal ideation.  She is currently admitted on a voluntary basis to the behavioral health hospital.    Information obtained from 24-hour nursing report: No remarkable events   Information Obtained Today During Patient Interview:  The patient has a linear and logical thought process.  She does not discuss delusional thought content.  She reports a fair mood.  She reports sleeping well and eating well.  She reports experiencing increased tremulousness as a result of only having Cogentin 2 mg once a day.  Her physical exam is unchanged but we did discuss increasing her Cogentin to 2 mg twice daily, and she was agreeable to this.  She denies experiencing any hallucinations.  She denies experiencing any thoughts of self-harm.  She denies experiencing paranoia.  Attempted to call the patient's sister again at the number listed in the chart.  No answer.  Total Time spent with patient: 20 min  Past Psychiatric History: as per H and P  Past Medical History:  Past Medical History:  Diagnosis Date   Bipolar 1 disorder (HCC)    Schizo affective schizophrenia (HCC)     History reviewed. No pertinent surgical history. Family History:  History reviewed. No pertinent family history. Family Psychiatric  History: as per H and P Social History:  Social History   Substance and Sexual Activity  Alcohol Use Yes   Comment: socially     Social History   Substance and Sexual Activity  Drug Use No    Social History   Socioeconomic History    Marital status: Single    Spouse name: Not on file   Number of children: Not on file   Years of education: Not on file   Highest education level: Not on file  Occupational History   Not on file  Tobacco Use   Smoking status: Every Day    Current packs/day: 0.50    Types: Cigarettes   Smokeless tobacco: Never  Substance and Sexual Activity   Alcohol use: Yes    Comment: socially   Drug use: No   Sexual activity: Not on file  Other Topics Concern   Not on file  Social History Narrative   Not on file   Social Drivers of Health   Financial Resource Strain: Not on file  Food Insecurity: No Food Insecurity (08/12/2024)   Hunger Vital Sign    Worried About Running Out of Food in the Last Year: Never true    Ran Out of Food in the Last Year: Never true  Transportation Needs: No Transportation Needs (08/12/2024)   PRAPARE - Administrator, Civil Service (Medical): No    Lack of Transportation (Non-Medical): No  Physical Activity: Not on file  Stress: Not on file  Social Connections: Not on file   Additional Social History:                         Sleep: fair  Appetite: fair  Current Medications: Current Facility-Administered Medications  Medication Dose Route Frequency  Provider Last Rate Last Admin   acetaminophen (TYLENOL) tablet 650 mg  650 mg Oral Q6H PRN Motley-Mangrum, Jadeka A, PMHNP   650 mg at 08/14/24 1501   alum & mag hydroxide-simeth (MAALOX/MYLANTA) 200-200-20 MG/5ML suspension 30 mL  30 mL Oral Q4H PRN Motley-Mangrum, Jadeka A, PMHNP       benztropine (COGENTIN) tablet 2 mg  2 mg Oral BID Marry Clamp, MD       haloperidol  (HALDOL ) tablet 5 mg  5 mg Oral TID PRN Motley-Mangrum, Jadeka A, PMHNP       And   diphenhydrAMINE (BENADRYL) capsule 50 mg  50 mg Oral TID PRN Motley-Mangrum, Jadeka A, PMHNP       haloperidol  lactate (HALDOL ) injection 5 mg  5 mg Intramuscular TID PRN Motley-Mangrum, Jadeka A, PMHNP       And   diphenhydrAMINE  (BENADRYL) injection 50 mg  50 mg Intramuscular TID PRN Motley-Mangrum, Jadeka A, PMHNP       And   LORazepam (ATIVAN) injection 2 mg  2 mg Intramuscular TID PRN Motley-Mangrum, Jadeka A, PMHNP       haloperidol  lactate (HALDOL ) injection 10 mg  10 mg Intramuscular TID PRN Motley-Mangrum, Jadeka A, PMHNP       And   diphenhydrAMINE (BENADRYL) injection 50 mg  50 mg Intramuscular TID PRN Motley-Mangrum, Jadeka A, PMHNP       And   LORazepam (ATIVAN) injection 2 mg  2 mg Intramuscular TID PRN Motley-Mangrum, Jadeka A, PMHNP       feeding supplement (ENSURE PLUS HIGH PROTEIN) liquid 237 mL  237 mL Oral BID BM Marry Clamp, MD   237 mL at 08/14/24 1253   hydrOXYzine (ATARAX) tablet 25 mg  25 mg Oral TID PRN Motley-Mangrum, Jadeka A, PMHNP   25 mg at 08/13/24 2039   magnesium hydroxide (MILK OF MAGNESIA) suspension 30 mL  30 mL Oral Daily PRN Motley-Mangrum, Jadeka A, PMHNP   30 mL at 08/13/24 2039   nicotine polacrilex (NICORETTE) gum 2 mg  2 mg Oral PRN Motley-Mangrum, Jadeka A, PMHNP   2 mg at 08/14/24 1501    Lab Results:  No results found for this or any previous visit (from the past 48 hours).  Blood Alcohol level:  Lab Results  Component Value Date   [facility] <15 08/12/2024   ETH <11 08/02/2011    Metabolic Disorder Labs: No results found for: HGBA1C, MPG No results found for: PROLACTIN No results found for: CHOL, TRIG, HDL, CHOLHDL, VLDL, LDLCALC  Physical Findings:   Psychiatric Specialty Exam: Physical Exam Constitutional:      Appearance: the patient is not toxic-appearing.  Pulmonary:     Effort: Pulmonary effort is normal.  Neurological:     General: No focal deficit present.     Mental Status: the patient is alert and oriented to person, place, and time.   Review of Systems  Respiratory:  Negative for shortness of breath.   Cardiovascular:  Negative for chest pain.  Gastrointestinal:  Negative for abdominal pain, constipation, diarrhea, nausea and  vomiting.  Neurological:  Negative for headaches.      BP 105/84 (BP Location: Right Arm)   Pulse 83   Temp 98.3 F (36.8 C) (Oral)   Resp 16   Ht 5' 1 (1.549 m)   Wt 59 kg   SpO2 100%   BMI 24.58 kg/m   General Appearance: Fairly Groomed  Eye Contact:  Good  Speech:  Clear and Coherent  Volume:  Normal  Mood: Okay  Affect: Constricted  Thought Process:  Coherent  Orientation:  Full (Time, Place, and Person)  Thought Content: Logical   Suicidal Thoughts:  No  Homicidal Thoughts:  No  Memory:  Immediate;   Good  Judgement:  fair  Insight: Poor  Psychomotor Activity: Truncal rocking, mild tremulousness in the right hand  Concentration:  Concentration: Good  Recall:  Good  Fund of Knowledge: Good  Language: Good  Akathisia:  No  Handed:  not assessed  AIMS (if indicated): not done  Assets:  Communication Skills Desire for Improvement Financial Resources/Insurance Housing Leisure Time Physical Health  ADL's:  Intact  Cognition: WNL  Sleep:  Fair      Treatment Plan Summary: Daily contact with patient to assess and evaluate symptoms and progress in treatment and Medication management  Diagnosis is schizoaffective disorder and stimulant use disorder-cocaine type  -Discontinue Invega 3 mg oral (s/p 1 dose) -Patient reports side effects and insists that she received Invega Trinza last week. -Dispensing pharmacy was unwilling to confirm administration when our pharmacist contacted them - Increase Cogentin from 2 mg daily to 2 mg twice daily, reportedly the patient's home dose -Should the patient appear actively psychotic, we can consider restarting oral Invega     Riana Tessmer, MD PGY-4

## 2024-08-14 NOTE — Progress Notes (Signed)
(  Sleep Hours) - 8.75 (Any PRNs that were needed, meds refused, or side effects to meds)- PRN:  Nicorette gum, Milk of Mag, Hydroxyzine (Any disturbances and when (visitation, over night)- None (Concerns raised by the patient)- None (SI/HI/AVH)- Denied

## 2024-08-14 NOTE — Group Note (Signed)
 Date:  08/14/2024 Time:  5:25 AM  Group Topic/Focus:  Wrap-Up Group:   The focus of this group is to help patients review their daily goal of treatment and discuss progress on daily workbooks.    Participation Level:  Did Not Attend  Participation Quality:  none  Affect:  none  Cognitive:  none  Insight: None  Engagement in Group:  none  Modes of Intervention:  none  Additional Comments:   Pt was encouraged but refused to attend wrap up group  Caellum Mancil A Arael Piccione 08/14/2024, 5:25 AM

## 2024-08-14 NOTE — Group Note (Signed)
 Date:  08/14/2024 Time:  8:58 PM  Group Topic/Focus:  Wrap-Up Group:   The focus of this group is to help patients review their daily goal of treatment and discuss progress on daily workbooks.    Participation Level:  Did Not Attend  Participation Quality:  N/A  Affect:  N/A  Cognitive:  N/A  Insight: None  Engagement in Group:  N/A  Modes of Intervention:  N/A  Additional Comments:  Patient did not attend  Eward Mace 08/14/2024, 8:58 PM

## 2024-08-14 NOTE — [facility] Suicide Risk Assessment (Signed)
 [facility] INPATIENT:  Family/Significant Other Suicide Prevention Education  Suicide Prevention Education:  Contact Attempts: [name] (sister) [phone], (name of family member/significant other) has been identified by the patient as the family member/significant other with whom the patient will be residing, and identified as the person(s) who will aid the patient in the event of a mental health crisis.  With written consent from the patient, two attempts were made to provide suicide prevention education, prior to and/or following the patient's discharge.  We were unsuccessful in providing suicide prevention education.  A suicide education pamphlet was given to the patient to share with family/significant other.  Date and time of first attempt: 08/13/24 @ 350PM, LVM  Date and time of second attempt: 08/14/24 @ 150PM, voicemail full  Nancy Howell 08/14/2024, 1:40 PM

## 2024-08-14 NOTE — Group Note (Signed)
 Date:  08/14/2024 Time:  2:53 PM  Group Topic/Focus:  Emotional Education: The session centered around the Circle of Control activity, which helps participants identify what aspects of their life they can control versus what is outside their control. This exercise encourages emotional awareness, personal empowerment, and stress management. Participants were guided to reflect on situations in their life and categorize them into three areas: Things I can control, Things I can influence, and Things I cannot control. The group was encouraged to reflect on the impact these distinctions have on their emotional well-being.  Participation Level:  Did Not Attend   Lauris JONELLE Morales 08/14/2024, 2:53 PM

## 2024-08-14 NOTE — Plan of Care (Signed)
   Problem: Education: Goal: Emotional status will improve Outcome: Not Progressing Goal: Mental status will improve Outcome: Not Progressing

## 2024-08-14 NOTE — Group Note (Signed)
 Date:  08/14/2024 Time:  4:34 PM  Group Topic/Focus:  Overcoming Stress:   The focus of this group is to define stress and help patients assess their triggers.    Participation Level:  Did Not Attend   Annalee Larch 08/14/2024, 4:34 PM

## 2024-08-14 NOTE — Progress Notes (Signed)
   08/14/24 2100  Psych Admission Type (Psych Patients Only)  Admission Status Voluntary  Psychosocial Assessment  Patient Complaints Anxiety  Eye Contact Brief  Facial Expression Anxious  Affect Anxious  Speech Logical/coherent  Interaction Childlike  Motor Activity Hand-wringing  Appearance/Hygiene Unremarkable  Behavior Characteristics Anxious  Mood Anxious  Aggressive Behavior  Effect No apparent injury  Thought Process  Coherency Circumstantial  Content Preoccupation  Delusions Somatic  Perception Derealization  Hallucination None reported or observed  Judgment Poor  Confusion None  Danger to Self  Current suicidal ideation? Denies  Danger to Others  Danger to Others None reported or observed

## 2024-08-14 NOTE — Group Note (Signed)
 LCSW Group Therapy Note   Group Date: 08/14/2024 Start Time: 1100 End Time: 1200   Participation:  patient was present for half of the group session.  She actively participated in the discussion.  Type of Therapy:  Group Therapy  Topic:  Speaking from the Heart:  Communicating with Understanding and Empathy  Objective:  To help participants develop effective communication skills to express themselves clearly, listen actively, and navigate conflicts in a healthy way.  Goals: Increase awareness of verbal and non-verbal communication skills. Practice using "I" statements and active listening techniques. Learn coping strategies for managing communication stress.  Summary:  Participants explored the importance of communication, discussed challenges, and practiced skills such as active listening and assertive expression. They reflected on past experiences and identified ways to improve communication in their daily lives.  Therapeutic Modalities: Cognitive-Behavioral Therapy (CBT): Restructuring negative thought patterns in communication. Mindfulness: Staying present and calm during conversations. Psychoeducation: Learning about effective communication techniques.   Pakou Rainbow O Niley Helbig, LCSWA 08/14/2024  12:53 PM

## 2024-08-14 NOTE — Group Note (Signed)
 Date:  08/14/2024 Time:  9:44 AM  Group Topic/Focus:  Goals Group:   The focus of this group is to help patients establish daily goals to achieve during treatment and discuss how the patient can incorporate goal setting into their daily lives to aide in recovery.    Participation Level:  Did Not Attend   Nancy Howell 08/14/2024, 9:44 AM

## 2024-08-14 NOTE — Progress Notes (Signed)
 Tour of Duty:  Prentice JINNY Angle, RN, 08/14/24, Tour of Duty: 0700-1900  SI/HI/AVH: Denies  Self-Reported   Mood: Negative  Anxiety: Endorses Depression: Endorses Irritability: Denies, but Observable  Broset  Violence Prevention Guidelines *See Row Information*: Small Violence Risk interventions implemented   LBM  Last BM Date :  (a week ago)   Pain: present, PRN provided (see MAR)  Patient Refusals (including Rx): No  >>Shift Summary: Patient observed to be anxious and needy on unit. Patient able to make needs known. Patient frequently seeks out staff and PRN medication. Patient observed to engage appropriately with peers. Difficulty accepting no and wait answers. Patient taking medications as prescribed. This shift, several PRN medications requested or required. No reported or observed side effects to medication. No reported or observed agitation, aggression, or other acute emotional distress. No reported or observed physical abnormalities or concerns.   Last Vitals  Vitals Weight: 59 kg Temp: 98.3 F (36.8 C) Temp Source: Oral Pulse Rate: 89 Resp: 16 BP: (!) 119/93 Patient Position: (not recorded)  Admission Type  Psych Admission Type (Psych Patients Only) Admission Status: Voluntary Date 72 hour document signed : (not recorded) Time 72 hour document signed : (not recorded) Provider Notified (First and Last Name) (see details for LINK to note): (not recorded)   Psychosocial Assessment  Psychosocial Assessment Patient Complaints: Anxiety Eye Contact: Brief Facial Expression: Anxious Affect: Anxious Speech: Rapid Interaction: Childlike, Manipulative, Needy Motor Activity: Fidgety Appearance/Hygiene: Unremarkable Behavior Characteristics: Anxious Mood: Anxious   Aggressive Behavior  Targets: (not recorded)   Thought Process  Thought Process Coherency: Circumstantial Content: Preoccupation Delusions: Somatic Perception: Derealization Hallucination:  None reported or observed Judgment: Poor Confusion: None  Danger to Self/Others  Danger to Self Current suicidal ideation?: Denies Description of Suicide Plan: (not recorded) Self-Injurious Behavior: (not recorded) Agreement Not to Harm Self: (not recorded) Description of Agreement: (not recorded) Danger to Others: None reported or observed

## 2024-08-15 DIAGNOSIS — F250 Schizoaffective disorder, bipolar type: Secondary | ICD-10-CM | POA: Diagnosis not present

## 2024-08-15 DIAGNOSIS — R45851 Suicidal ideations: Secondary | ICD-10-CM | POA: Diagnosis not present

## 2024-08-15 DIAGNOSIS — F1420 Cocaine dependence, uncomplicated: Secondary | ICD-10-CM | POA: Diagnosis not present

## 2024-08-15 MED ORDER — TRAZODONE HCL 50 MG PO TABS
50.0000 mg | ORAL_TABLET | Freq: Every evening | ORAL | Status: DC | PRN
  Administered 2024-08-15 – 2024-09-02 (×13): 50 mg via ORAL
  Filled 2024-08-15 (×17): qty 1

## 2024-08-15 MED ORDER — DIPHENHYDRAMINE HCL 25 MG PO CAPS
50.0000 mg | ORAL_CAPSULE | Freq: Once | ORAL | Status: AC
  Administered 2024-08-15: 50 mg via ORAL
  Filled 2024-08-15: qty 2

## 2024-08-15 MED ORDER — IBUPROFEN 600 MG PO TABS
600.0000 mg | ORAL_TABLET | Freq: Four times a day (QID) | ORAL | Status: DC | PRN
  Administered 2024-08-15 – 2024-09-02 (×29): 600 mg via ORAL
  Filled 2024-08-15 (×29): qty 1

## 2024-08-15 MED ORDER — ACETAMINOPHEN 500 MG PO TABS
1000.0000 mg | ORAL_TABLET | Freq: Four times a day (QID) | ORAL | Status: DC | PRN
  Administered 2024-08-15 – 2024-09-02 (×17): 1000 mg via ORAL
  Filled 2024-08-15 (×18): qty 2

## 2024-08-15 MED ORDER — INVEGA TRINZA 546 MG/1.75ML IM SUSY: 546.0000 mg | PREFILLED_SYRINGE | Freq: Once | INTRAMUSCULAR | Status: AC

## 2024-08-15 NOTE — Progress Notes (Signed)
 Nancy Howell  Called and spoke with pt's sister, [name] [phone] (written and verbal consent given from pt). [name] reports patient must go to substance use treatment prior to coming to her home.   Discussed with [name] that a referral has been sent to [facility] and awaiting admission decision at this time.  Also informed sister of limited short-term substance use facilities due to patient being uninsured. [name] would like pt to go to long-term treatment however verbalized understanding that it is pt's choice.   Sister is looking into being POA to ensure pt gets what she needs when released.   Discussed the referral that has been sent to [facility] to assist with pt re-applying for disability. [name] plans to help pt follow up on this post discharge.   [name] would like to be updated regarding pt's treatment when a discharge date is set.  Will continue to assist as needed.  Signed:  Jacorey Donaway, LCSW-A 08/15/2024  2:53 PM

## 2024-08-15 NOTE — Progress Notes (Addendum)
 [facility] MD Progress Note  08/15/2024 1:48 PM Nancy Howell  MRN:  [id number]  Principal Problem: Severe stimulant use disorder (HCC) Diagnosis:  Principal Problem:   Severe stimulant use disorder (HCC) Active Problems:   GAD (generalized anxiety disorder)   Schizoaffective disorder (HCC)    Reason for Admission:  Nancy Howell is a 48 year old female with a past psychiatric history of schizoaffective disorder bipolar type, GAD, and cocaine use disorder with recent cocaine use who presented to ED on 09/30 for suicidal ideation.  She is currently admitted on a voluntary basis to the behavioral health hospital.    Last 24 hours: No conerns per nursing. Per nursing preoccupied, somatic, and circumstantial but no RTIS or other manic-like behavior. Slept 10.75 hours last night. Getting scheduled cogentin new dose last night. Got ensure yesterday afternoon. Hydrox x1 last night, nicotine gun every few hours, and tylenol every few hours. Not attending groups.  Per social workers patient has been referred to [facility] and this is her only option given uninsured and other uninsured programs or 1 year programs.   Information Obtained Today During Patient Interview:  Reports that she is still exhausted from her time in [place]  during which time she was on the streets and sleeping on Sundays.  Reports that she feels like she is at her baseline.  Reports that she will stay with her sister at discharge.  Discussed that sister wants her to go to residential treatment and patient was agreeable to this.  Says that she does not think she needs that treatment but she is willing to do it for her sister.  Reports that she needs to change her insurance over to [facility] and inform patient that I would provide this in her discharge and provide a physical copy to her.  Patient reports that she has been having some vaginal bleeding and says that she has gone through menopause and says the bleeding started yesterday and she has  not been having abnormal bleeding for longer than this.  Furthermore reports that she has cancer and is unsure if she is on treatment or not.  Discussed with patient that we will provide resources for primary care doctor at discharge.  Patient reports that she slept okay last night but as previously stated is still tired due to prior to admission environment.  Reports that her appetite has been good.  Reports that she feels better on her Cogentin 2 mg twice daily dose and has less tremors.  She does report having some pain in her shoulders and has difficulty describing it.  She does deny having any injuries and says that this has been going on long-term and says it starts in her back and radiates to her shoulder.  Discussed with patient that this may be related to the medication as a long-term side effect and that she may want to consider coming off paliperidone in the future for something that has less EPS.  Also discussed with patient that may be related to spine issues.  She was agreeable to continuing with current course at this time and following up with her outpatient doctor and primary care doctor to determine best course for this pain.  Did discuss with patient adding on ibuprofen and increasing Tylenol to 1 g.  Collateral, sister, [phone]: Reported that she spoke with patient yesterday and think she is doing better but still has some disorganized thoughts still.  Reports that she cannot take patient back at discharge.  Report that she  lives in [place] which is where patient will stay moving forward.  Says that she does want patient to go to substance use treatment.  Reports that going to substance use treatment would be a contingency for patient returning to the home.  Reports that she is off tomorrow and Sunday if we need to talk more but says she is at work and cannot talk further at this moment.   Collateral, [facility], [phone]: called to confirm LAI  dose and date. They reported: Invega Trinza 546 MG/1.75ML injection given on 07/28/2024 -- next dose 10/19/2024.     Total Time spent with patient: 20 min  Past Psychiatric History: Schizoaffective disorder, Bipolar I Disorder, Substance0induce mood disorder, GAD.  Stimulant use disorder.  Recent hospitalization at [facility] where she was discharged on Invega Trinza 546 MG/1.75ML injection given on 07/28/2024 -- next dose 10/19/2024.   Past Medical History:  Past Medical History:  Diagnosis Date   Bipolar 1 disorder (HCC)    Schizo affective schizophrenia (HCC)     History reviewed. No pertinent surgical history. Family History:  History reviewed. No pertinent family history. Family Psychiatric  History: no pertinent.  Social History:  Social History   Substance and Sexual Activity  Alcohol Use Yes   Comment: socially     Social History   Substance and Sexual Activity  Drug Use No    Social History   Socioeconomic History   Marital status: Single    Spouse name: Not on file   Number of children: Not on file   Years of education: Not on file   Highest education level: Not on file  Occupational History   Not on file  Tobacco Use   Smoking status: Every Day    Current packs/day: 0.50    Types: Cigarettes   Smokeless tobacco: Never  Substance and Sexual Activity   Alcohol use: Yes    Comment: socially   Drug use: No   Sexual activity: Not on file  Other Topics Concern   Not on file  Social History Narrative   Not on file   Social Drivers of Health   Financial Resource Strain: Not on file  Food Insecurity: No Food Insecurity (08/12/2024)   Hunger Vital Sign    Worried About Running Out of Food in the Last Year: Never true    Ran Out of Food in the Last Year: Never true  Transportation Needs: No Transportation Needs (08/12/2024)   PRAPARE - Administrator, Civil Service (Medical): No    Lack of Transportation (Non-Medical): No  Physical  Activity: Not on file  Stress: Not on file  Social Connections: Not on file   Additional Social History:  She in unmarried and has no children. She reports having completed 10th grade, but then dropping out of school due to falling in with a bad crowd. She reports some moving back and forth between [place] and [place]. She denies any childhood trauma and reports a happy childhood in [place]. She says her father died of some cancer in 24, and her mother was murdered in 2010. She was previously unhoused in [place] . She will be living in [place] with her sister, Stephane, her sister's girlfriend, and her 2 kids. It is unclear if the patient is adopted, she mentioned living with an adoptive family but was unable to clarify further. She denies any legal issues or access to firearms.     Current Medications: Current  Facility-Administered Medications  Medication Dose Route Frequency Provider Last Rate Last Admin   acetaminophen (TYLENOL) tablet 1,000 mg  1,000 mg Oral Q6H PRN Lejuan Botto, MD       alum & mag hydroxide-simeth (MAALOX/MYLANTA) 200-200-20 MG/5ML suspension 30 mL  30 mL Oral Q4H PRN Motley-Mangrum, Jadeka A, PMHNP       benztropine (COGENTIN) tablet 2 mg  2 mg Oral BID Marry Clamp, MD   2 mg at 08/15/24 9145   haloperidol  (HALDOL ) tablet 5 mg  5 mg Oral TID PRN Motley-Mangrum, Jadeka A, PMHNP       And   diphenhydrAMINE (BENADRYL) capsule 50 mg  50 mg Oral TID PRN Motley-Mangrum, Jadeka A, PMHNP       haloperidol  lactate (HALDOL ) injection 5 mg  5 mg Intramuscular TID PRN Motley-Mangrum, Jadeka A, PMHNP       And   diphenhydrAMINE (BENADRYL) injection 50 mg  50 mg Intramuscular TID PRN Motley-Mangrum, Jadeka A, PMHNP       And   LORazepam (ATIVAN) injection 2 mg  2 mg Intramuscular TID PRN Motley-Mangrum, Jadeka A, PMHNP       haloperidol  lactate (HALDOL ) injection 10 mg  10 mg Intramuscular TID PRN Motley-Mangrum, Jadeka A, PMHNP       And   diphenhydrAMINE (BENADRYL) injection  50 mg  50 mg Intramuscular TID PRN Motley-Mangrum, Jadeka A, PMHNP       And   LORazepam (ATIVAN) injection 2 mg  2 mg Intramuscular TID PRN Motley-Mangrum, Jadeka A, PMHNP       feeding supplement (ENSURE PLUS HIGH PROTEIN) liquid 237 mL  237 mL Oral BID BM Marry Clamp, MD   237 mL at 08/15/24 1031   fluticasone (FLONASE) 50 MCG/ACT nasal spray 1 spray  1 spray Each Nare Q12H PRN Towana Leita SAILOR, MD   1 spray at 08/15/24 1239   hydrOXYzine (ATARAX) tablet 25 mg  25 mg Oral TID PRN Motley-Mangrum, Jadeka A, PMHNP   25 mg at 08/15/24 1156   ibuprofen (ADVIL) tablet 600 mg  600 mg Oral Q6H PRN Faithann Natal, MD   600 mg at 08/15/24 1156   magnesium hydroxide (MILK OF MAGNESIA) suspension 30 mL  30 mL Oral Daily PRN Motley-Mangrum, Jadeka A, PMHNP   30 mL at 08/13/24 2039   nicotine polacrilex (NICORETTE) gum 2 mg  2 mg Oral PRN Motley-Mangrum, Jadeka A, PMHNP   2 mg at 08/15/24 1239    Lab Results:  No results found for this or any previous visit (from the past 48 hours).  Blood Alcohol level:  Lab Results  Component Value Date   [facility] <15 08/12/2024   ETH <11 08/02/2011    Metabolic Disorder Labs: No results found for: HGBA1C, MPG No results found for: PROLACTIN No results found for: CHOL, TRIG, HDL, CHOLHDL, VLDL, LDLCALC  Physical Findings:   Psychiatric Specialty Exam: Physical Exam Constitutional:      Appearance: the patient is not toxic-appearing.  Pulmonary:     Effort: Pulmonary effort is normal.  Neurological:     General: No focal deficit present.     Mental Status: the patient is alert and oriented to person, place, and time.  Passive range of motion pain in bilateral shoulders. No cogwheeling throuhgout with passive range of motion. AIMS 1 for mouth movement distracted.   Review of Systems  Respiratory:  Negative for shortness of breath.   Cardiovascular:  Negative for chest pain.  Gastrointestinal:  Negative for abdominal pain, constipation,  diarrhea, nausea and vomiting.  Neurological:  Negative for headaches.      BP 107/85 (BP Location: Right Arm)   Pulse 92   Temp 98.7 F (37.1 C) (Oral)   Resp 16   Ht 5' 1 (1.549 m)   Wt 59 kg   SpO2 100%   BMI 24.58 kg/m   General Appearance: Fairly Groomed  Eye Contact:  Good  Speech:  Clear and Coherent  Volume:  Normal  Mood: fine  Affect: congruent  Thought Process:  Coherent, linear  Orientation:  Full (Time, Place, and Person)  Thought Content: Logical, chronic delusions about being clone of famous people that are not impairing to functioning, no hallucinations, no paranoia.   Suicidal Thoughts:  No  Homicidal Thoughts:  No  Memory:  Immediate;   Good  Judgement:  fair  Insight: fair  Psychomotor Activity:  normal, AIMS 1  Concentration:  Concentration: Good  Recall:  Good  Fund of Knowledge: Good  Language: Good  Akathisia:  No  Handed:  not assessed  AIMS (if indicated): not done  Assets:  Communication Skills Desire for Improvement Financial Resources/Insurance Housing Leisure Time Physical Health  ADL's:  Intact  Cognition: WNL  Sleep:  Fair      Treatment Plan Summary: Daily contact with patient to assess and evaluate symptoms and progress in treatment and Medication management  Assessment Principal Problem:   Severe stimulant use disorder (HCC) Active Problems:   GAD (generalized anxiety disorder)   Schizoaffective disorder (HCC)  Nancy Howell is a 48 year old female with a past psychiatric history of schizoaffective disorder bipolar type, GAD, and cocaine use disorder with recent cocaine use who presented to ED on 09/30 for suicidal ideation.  She is currently admitted on a voluntary basis to the behavioral health hospital.   Confirmed today that patient received LAI on 9/15 which would put her LAI on 12/7 which is a Sunday so we will make it 12/8 so she can get on Monday. This has been put in discharge order.  Patient has some delusions  still (see think she is a clone of famous people such as [employer] [address]) but no obvious hallucinations, paranoia, disorganization. She is stable to transfer to [facility] residential once accepted.  Patient will stay with sister moving forward but patient doing residential treatment is contingent on her going there.  Referral has been placed for patient to go to [facility] as this is her only option without insurance.  Other options are 1 year or longer.  Patient's shoulder pains may be tardive dyskinesia so may benefit from changing from paliperidone in the future but will continue for now and see if it improves with her normal benztropine dose.  Increased Tylenol to 1 g q6hrs given pt normal LFTs and can safety be given up to 4 g daily in adults.  Also added on ibuprofen 600 mg q6hrs given normal renal function.  Patient does have unspecified breast cancer diagnosis and some abnormal uterine bleeding which may be due to menopause but patient should follow-up PCP, so options were provided in her discharge paperwork.     Plan --Continue Invega Trinza 546 mg once every 12 weeks for schizoaffective; next dose on 10/20/2024 included in discharge orders --Consider oral antipsychotic if psychosis  - Continue Cogentin 2 mg twice daily, continue to monitor for EPS -- Follow-up lipid panel, A1c for antipsychotic labs --Follow-up RPR for medical rule out for psychosis -- Follow up on [facility] residential treatment; Inform  sister about only option being [facility] given pt uninsured. If not accepted pt may have to return without residential treatment.      Justino Cornish, MD PGY-2 Psychiatry Resident 08/15/2024, 1:48 PM

## 2024-08-15 NOTE — Group Note (Signed)
 Date:  08/15/2024 Time:  8:57 PM  Group Topic/Focus:  Wrap-Up Group:   The focus of this group is to help patients review their daily goal of treatment and discuss progress on daily workbooks.    Participation Level:  Did Not Attend  Participation Quality:  N/A  Affect:  N/A  Cognitive:  N/A  Insight: None  Engagement in Group:  N/A  Modes of Intervention:  N/A  Additional Comments:  Patient did not attend  Eward Mace 08/15/2024, 8:57 PM

## 2024-08-15 NOTE — Plan of Care (Signed)

## 2024-08-15 NOTE — Group Note (Signed)
 Date:  08/15/2024 Time:  7:25 PM  Group Topic/Focus:  Developing a Wellness Toolbox:   The focus of this group is to help patients develop a wellness toolbox with skills and strategies to promote recovery upon discharge. Emotional Education:   The focus of this group is to discuss what feelings/emotions are, and how they are experienced.  Pt explore interventions such as exercise, medication management and talk therapy to maintain therapy when d/c for healthy living in community.       Additional Comments: Pt did not attend group.  Carly Sabo 08/15/2024, 7:25 PM

## 2024-08-15 NOTE — Plan of Care (Signed)
   Problem: Education: Goal: Emotional status will improve Outcome: Progressing Goal: Mental status will improve Outcome: Progressing

## 2024-08-15 NOTE — Progress Notes (Signed)
   08/15/24 0855  Psych Admission Type (Psych Patients Only)  Admission Status Voluntary  Psychosocial Assessment  Patient Complaints Sleep disturbance  Eye Contact Brief  Facial Expression Animated  Affect Anxious  Speech Logical/coherent  Interaction Childlike  Motor Activity Fidgety  Appearance/Hygiene Unremarkable  Behavior Characteristics Anxious  Mood Anxious  Thought Process  Coherency WDL  Content WDL  Delusions None reported or observed  Perception WDL  Hallucination None reported or observed  Judgment Poor  Confusion None  Danger to Self  Current suicidal ideation? Denies  Danger to Others  Danger to Others None reported or observed

## 2024-08-15 NOTE — Progress Notes (Signed)
   08/15/24 2207  Psych Admission Type (Psych Patients Only)  Admission Status Voluntary  Psychosocial Assessment  Patient Complaints Anxiety  Eye Contact Brief  Facial Expression Animated  Affect Anxious  Speech Logical/coherent  Interaction Childlike  Motor Activity Fidgety  Appearance/Hygiene Unremarkable  Behavior Characteristics Anxious  Mood Anxious  Thought Process  Coherency WDL  Content WDL  Delusions None reported or observed  Perception WDL  Hallucination None reported or observed  Judgment Poor  Confusion None  Danger to Self  Current suicidal ideation? Denies  Danger to Others  Danger to Others None reported or observed

## 2024-08-15 NOTE — Progress Notes (Signed)
(  Sleep Hours) -10.75 (Any PRNs that were needed, meds refused, or side effects to meds)-  (Any disturbances and when (visitation, over night)- (Concerns raised by the patient)-  pt stated she has been having on going arm pain. Pt stated she is having vaginal bleeding in spite of her going through menopause  (SI/HI/AVH)-Denied

## 2024-08-15 NOTE — Progress Notes (Deleted)
   08/15/24 0855  Psych Admission Type (Psych Patients Only)  Admission Status Voluntary  Psychosocial Assessment  Patient Complaints Sleep disturbance  Eye Contact Brief  Facial Expression Animated  Affect Apathetic  Speech Logical/coherent  Interaction Childlike  Motor Activity Fidgety  Appearance/Hygiene Unremarkable  Behavior Characteristics Cooperative  Mood Pleasant  Thought Process  Coherency WDL  Content WDL  Delusions None reported or observed  Perception WDL  Hallucination None reported or observed  Judgment Poor  Confusion None  Danger to Self  Current suicidal ideation? Denies  Danger to Others  Danger to Others None reported or observed

## 2024-08-15 NOTE — Progress Notes (Addendum)
 Collateral contact - [facility], Disability Assistance Program / SOAR program [address] [place], [place] [address], [phone]   CSW emailed the referral via secure email to [facility] [email or web address], [email or web address] and [email or web address]   Karna confirmed that the referral has been received.  CSW gave patient the original referral.   Enrika Aguado, LCSWA 08/15/2024

## 2024-08-15 NOTE — Progress Notes (Signed)
 Nursing Progress Note:   (Sleep Hours) - 12.25  (Any PRNs that were needed, meds refused, or side effects to meds)- Administered PRN Ibuprofen and Hydroxyzine per MAR per pt. Request. Administered PRN Trazodone per MAR per pt request.  (Any disturbances and when (visitation, over night)- None  (Concerns raised by the patient)- Pt. Concerned about Right shoulder pain 4/10 and anxiety; Pt. Became anxious, believing someone had taken her things; Issue was resolved; patient apologetic sharing that it has been her experience that people take her things. Pt unable to sleep; requesting Trazodone. Pt expressed she was concerned that she had been bleeding since she got to the hospital. Pt is referring to her menstrual cycle but then mentioned menopause.  (SI/HI/AVH)- Denies

## 2024-08-15 NOTE — Group Note (Signed)
 Date:  08/15/2024 Time:  9:50 AM  Group Topic/Focus:  Goals Group:   The focus of this group is to help patients establish daily goals to achieve during treatment and discuss how the patient can incorporate goal setting into their daily lives to aide in recovery.    Participation Level:  Did Not Attend  Participation Quality:  N/A  Affect:  N/A  Cognitive:  N/A  Insight: None  Engagement in Group:  None  Modes of Intervention:  N/A  Additional Comments:  Patient did not attend goals group.  Kristi HERO Janiene Aarons 08/15/2024, 9:50 AM

## 2024-08-15 NOTE — Discharge Instructions (Signed)
 Applying for [facility] -> 2nd Floor  [address] [place], [place] [address]  Ask to speak with: Marien Louder   M-F 8-5pm [phone]   ---------------------------------------------  -Follow-up with your outpatient psychiatric provider (and therapist) -instructions on appointment date, time, and address (location) are provided to you in discharge paperwork.  -Take your psychiatric medications as prescribed at discharge - instructions are provided to you in the discharge paperwork  -Follow-up with outpatient primary care doctor and other specialists -for management of preventative medicine and any chronic medical disease.  -Recommend abstinence from alcohol, tobacco, cannabis, and other substances at discharge.   -If your psychiatric symptoms recur, worsen, or if you have severe side effects to your psychiatric medications, call your outpatient psychiatric provider, 911, 988 (national suicide hotline), go to [facility], or go to the nearest emergency department.  -If suicidal thoughts occur, call your outpatient psychiatric provider, 911, 988 (national suicide hotline), go to [facility], or go to the nearest emergency department.  Naloxone (Narcan) can help reverse an overdose when given to the victim quickly.  [facility] offers free naloxone kits and instructions/training on its use.  Add naloxone to your first aid kit and you can help save a life.   Pick up your free kit at the following locations:   [place]:  [facility], [address] [place] [address] [phone]) [facility] [address] [place] [address] [phone]) [facility] [address] [place] [address]  [place]: [facility] [address] [place] [address] ([phone]) [facility] [address] [place] [address] [phone])

## 2024-08-15 NOTE — Progress Notes (Addendum)
 [name]  Spoke with patient this morning at bedside. Pt reports speaking with her sister last night on the phone around 830-900PM. Reports sister works at a group home and does not get off until late evening which is why no one can get in contact with her. Reports she does not work on the weekends, this Clinical research associate will attempt to reach sister tomorrow.  Pt inquired about discharge, reporting her sister told her last night she is leaving the hospital today. Expressed to patient the importance of treatment team speaking with sister before discussing a discharge date. Pt verbalized understanding.   Pt states sister likely will want her to go to substance use treatment before coming home, but she is not interested in doing so and will look into options once discharged. Open to substance use resources, list has been provided to pt. Pt agreeable for [facility] referral to be sent but is not necessarily interested in a door-to-door admission if accepted.   Referral to be sent today.   1200PM: [facility] referral faxed.  Signed:  Kayly Kriegel, LCSW-A 08/15/2024  10:50 AM

## 2024-08-15 NOTE — Group Note (Signed)
 Recreation Therapy Group Note   Group Topic:Team Building  Group Date: 08/15/2024 Start Time: 0935 End Time: 1013 Facilitators: Polly Barner-McCall, LRT,CTRS Location: [address]   Group Topic: Communication, Team Building, Problem Solving  Goal Area(s) Addresses:  Patient will effectively work with peer towards shared goal.  Patient will identify skills used to make activity successful.  Patient will identify how skills used during activity can be used to reach post d/c goals.   Behavioral Response:   Intervention: STEM Activity  Activity: Straw Bridge. In teams of 3-5, patients were given 15 plastic drinking straws and an equal length of masking tape. Using the materials provided, patients were instructed to build a free standing bridge-like structure to suspend an everyday item (ex: puzzle box) off of the floor or table surface. All materials were required to be used by the team in their design. LRT facilitated post-activity discussion reviewing team process. Patients were encouraged to reflect how the skills used in this activity can be generalized to daily life post discharge.   Education: Pharmacist, community, Scientist, physiological, Discharge Planning   Education Outcome: Acknowledges education/In group clarification offered/Needs additional education.    Affect/Mood: N/A   Participation Level: Did not attend    Clinical Observations/Individualized Feedback:      Plan: Continue to engage patient in RT group sessions 2-3x/week.   Baelyn Doring-McCall, LRT,CTRS 08/15/2024 12:19 PM

## 2024-08-16 DIAGNOSIS — F1420 Cocaine dependence, uncomplicated: Secondary | ICD-10-CM | POA: Diagnosis not present

## 2024-08-16 DIAGNOSIS — F250 Schizoaffective disorder, bipolar type: Secondary | ICD-10-CM | POA: Diagnosis not present

## 2024-08-16 DIAGNOSIS — R45851 Suicidal ideations: Secondary | ICD-10-CM | POA: Diagnosis not present

## 2024-08-16 LAB — LIPID PANEL
Cholesterol: 172 mg/dL (ref 0–200)
HDL: 54 mg/dL (ref >40)
LDL Cholesterol: 96 mg/dL (ref 0–99)
Total CHOL/HDL Ratio: 3.2 ratio
Triglycerides: 108 mg/dL (ref <150)
VLDL: 22 mg/dL (ref 0–40)

## 2024-08-16 LAB — HEMOGLOBIN A1C
Hgb A1c MFr Bld: 4.9 % (ref 4.8–5.6)
Mean Plasma Glucose: 93.93 mg/dL

## 2024-08-16 LAB — RPR
RPR Ser Ql: REACTIVE — AB
RPR Titer: 1:2 {titer}

## 2024-08-16 MED ORDER — FLUTICASONE PROPIONATE 50 MCG/ACT NA SUSP
2.0000 | Freq: Every day | NASAL | Status: DC
  Administered 2024-08-16 – 2024-09-03 (×19): 2 via NASAL

## 2024-08-16 NOTE — Group Note (Signed)
 Date:  08/16/2024 Time:  4:51 PM  Group Topic/Focus:  Making Healthy Choices:   The focus of this group is to help patients identify negative/unhealthy choices they were using prior to admission and identify positive/healthier coping strategies to replace them upon discharge. Self Care:   The focus of this group is to help patients understand the importance of self-care and sleep hygiene in order to improve or restore emotional, physical, spiritual, interpersonal, and financial health.   Additional Comments:  Patient did not attend  Nancy Howell 08/16/2024, 4:51 PM

## 2024-08-16 NOTE — BHH Group Notes (Signed)
 Psychoeducational Group Note  Date:  08/16/2024 Time:  2000  Group Topic/Focus:  Wrap up group  Participation Level: Did Not Attend  Participation Quality:  Not Applicable  Affect:  Not Applicable  Cognitive:  Not Applicable  Insight:  Not Applicable  Engagement in Group: Not Applicable  Additional Comments:  Did not attend.   Lenora Manuelita RAMAN 08/16/2024, 10:58 PM

## 2024-08-16 NOTE — Plan of Care (Signed)
   Problem: Activity: Goal: Interest or engagement in activities will improve Outcome: Progressing   Problem: Coping: Goal: Ability to demonstrate self-control will improve Outcome: Progressing

## 2024-08-16 NOTE — Progress Notes (Signed)
   08/16/24 1957  Psych Admission Type (Psych Patients Only)  Admission Status Voluntary  Psychosocial Assessment  Patient Complaints Worrying;Confusion  Eye Contact Brief  Facial Expression Animated  Affect Appropriate to circumstance  Speech Logical/coherent  Interaction Childlike  Motor Activity Fidgety  Appearance/Hygiene Unremarkable  Behavior Characteristics Cooperative  Mood Anxious  Thought Process  Coherency WDL  Content WDL  Delusions None reported or observed  Perception WDL  Hallucination None reported or observed  Judgment Poor  Confusion None  Danger to Self  Current suicidal ideation? Denies  Danger to Others  Danger to Others None reported or observed

## 2024-08-16 NOTE — Progress Notes (Addendum)
 [facility] MD Progress Note  08/16/2024 1:02 PM Nancy Howell  MRN:  [id number]  Principal Problem: Severe stimulant use disorder (HCC) Diagnosis:  Principal Problem:   Severe stimulant use disorder (HCC) Active Problems:   GAD (generalized anxiety disorder)   Schizoaffective disorder (HCC)    Reason for Admission:  Nancy Howell is a 48 year old female with a past psychiatric history of schizoaffective disorder bipolar type, GAD, and cocaine use disorder with recent cocaine use who presented to ED on 09/30 for suicidal ideation.  She is currently admitted on a voluntary basis to the behavioral health hospital.    Last 24 hours: No concerns overnight.  Patient was unable to sleep, required trazodone.  After trazodone patient slept 12.25 hours. Not attending groups.  Per social workers patient has been referred to [facility] and this is her only option given uninsured and other uninsured programs or 1 year programs.   Information Obtained Today During Patient Interview:  Patient reports concerns about continued vaginal bleeding since hospitalization.  Patient states she went through menopause 2 years ago, but started bleeding upon her hospitalization.  Patient reports the voices and hallucinations have gotten significantly better with the addition of her medication.  Patient continues to have pain in her shoulders and is seen lying in bed with a heating pad.  Patient states she just took a Tylenol and is hoping will take effect soon.  Patient reports her appetite has been good.  Patient states she has not seen any spirits or ghost today or heard voices, but said last night she had difficulty falling asleep because she could hear the spirits in her room.  Patient denies SI and HI.  Collateral, sister, [phone]: Reported that she spoke with patient yesterday and think she is doing better but still has some disorganized thoughts still.  Reports that she cannot take patient back at discharge.  Report that she  lives in [facility] which is where patient will stay moving forward.  Says that she does want patient to go to substance use treatment.  Reports that going to substance use treatment would be a contingency for patient returning to the home.  Reports that she is off tomorrow and Sunday if we need to talk more but says she is at work and cannot talk further at this moment.   Collateral, [facility], [phone]: called to confirm LAI dose and date. They reported: Invega Trinza 546 MG/1.75ML injection given on 07/28/2024 -- next dose 10/19/2024.     Total Time spent with patient: 20 min  Past Psychiatric History: Schizoaffective disorder, Bipolar I Disorder, Substance0induce mood disorder, GAD.  Stimulant use disorder.  Recent hospitalization at [facility] where she was discharged on Invega Trinza 546 MG/1.75ML injection given on 07/28/2024 -- next dose 10/19/2024.   Past Medical History:  Past Medical History:  Diagnosis Date   Bipolar 1 disorder (HCC)    Schizo affective schizophrenia (HCC)     History reviewed. No pertinent surgical history. Family History:  History reviewed. No pertinent family history. Family Psychiatric  History: no pertinent.  Social History:  Social History   Substance and Sexual Activity  Alcohol Use Yes   Comment: socially     Social History   Substance and Sexual Activity  Drug Use No    Social History   Socioeconomic History   Marital status: Single    Spouse name: Not on file   Number of children: Not on file   Years  of education: Not on file   Highest education level: Not on file  Occupational History   Not on file  Tobacco Use   Smoking status: Every Day    Current packs/day: 0.50    Types: Cigarettes   Smokeless tobacco: Never  Substance and Sexual Activity   Alcohol use: Yes    Comment: socially   Drug use: No   Sexual activity: Not on file  Other Topics Concern   Not on file  Social History  Narrative   Not on file   Social Drivers of Health   Financial Resource Strain: Not on file  Food Insecurity: No Food Insecurity (08/12/2024)   Hunger Vital Sign    Worried About Running Out of Food in the Last Year: Never true    Ran Out of Food in the Last Year: Never true  Transportation Needs: No Transportation Needs (08/12/2024)   PRAPARE - Administrator, Civil Service (Medical): No    Lack of Transportation (Non-Medical): No  Physical Activity: Not on file  Stress: Not on file  Social Connections: Not on file   Additional Social History:  She in unmarried and has no children. She reports having completed 10th grade, but then dropping out of school due to falling in with a bad crowd. She reports some moving back and forth between [place] and [place]. She denies any childhood trauma and reports a happy childhood in [place]. She says her father died of some cancer in 25, and her mother was murdered in 2010. She was previously unhoused in [place] . She will be living in [place] with her sister, Stephane, her sister's girlfriend, and her 2 kids. It is unclear if the patient is adopted, she mentioned living with an adoptive family but was unable to clarify further. She denies any legal issues or access to firearms.     Current Medications: Current Facility-Administered Medications  Medication Dose Route Frequency Provider Last Rate Last Admin   acetaminophen (TYLENOL) tablet 1,000 mg  1,000 mg Oral Q6H PRN McCarty, Artie, MD   1,000 mg at 08/16/24 1232   alum & mag hydroxide-simeth (MAALOX/MYLANTA) 200-200-20 MG/5ML suspension 30 mL  30 mL Oral Q4H PRN Motley-Mangrum, Jadeka A, PMHNP       benztropine (COGENTIN) tablet 2 mg  2 mg Oral BID Marry Clamp, MD   2 mg at 08/16/24 0806   haloperidol  (HALDOL ) tablet 5 mg  5 mg Oral TID PRN Motley-Mangrum, Jadeka A, PMHNP       And   diphenhydrAMINE (BENADRYL) capsule 50 mg  50 mg Oral TID PRN Motley-Mangrum, Jadeka A, PMHNP        haloperidol  lactate (HALDOL ) injection 5 mg  5 mg Intramuscular TID PRN Motley-Mangrum, Jadeka A, PMHNP       And   diphenhydrAMINE (BENADRYL) injection 50 mg  50 mg Intramuscular TID PRN Motley-Mangrum, Jadeka A, PMHNP       And   LORazepam (ATIVAN) injection 2 mg  2 mg Intramuscular TID PRN Motley-Mangrum, Jadeka A, PMHNP       haloperidol  lactate (HALDOL ) injection 10 mg  10 mg Intramuscular TID PRN Motley-Mangrum, Jadeka A, PMHNP       And   diphenhydrAMINE (BENADRYL) injection 50 mg  50 mg Intramuscular TID PRN Motley-Mangrum, Jadeka A, PMHNP       And   LORazepam (ATIVAN) injection 2 mg  2 mg Intramuscular TID PRN Motley-Mangrum, Jadeka A, PMHNP       feeding supplement (ENSURE  PLUS HIGH PROTEIN) liquid 237 mL  237 mL Oral BID BM Marry Clamp, MD   237 mL at 08/16/24 1018   fluticasone (FLONASE) 50 MCG/ACT nasal spray 1 spray  1 spray Each Nare Q12H PRN Towana Leita SAILOR, MD   1 spray at 08/15/24 1239   fluticasone (FLONASE) 50 MCG/ACT nasal spray 2 spray  2 spray Each Nare Daily McCarty, Artie, MD   2 spray at 08/16/24 0805   hydrOXYzine (ATARAX) tablet 25 mg  25 mg Oral TID PRN Motley-Mangrum, Jadeka A, PMHNP   25 mg at 08/15/24 2207   ibuprofen (ADVIL) tablet 600 mg  600 mg Oral Q6H PRN McCarty, Artie, MD   600 mg at 08/16/24 9191   magnesium hydroxide (MILK OF MAGNESIA) suspension 30 mL  30 mL Oral Daily PRN Motley-Mangrum, Jadeka A, PMHNP   30 mL at 08/13/24 2039   nicotine polacrilex (NICORETTE) gum 2 mg  2 mg Oral PRN Motley-Mangrum, Jadeka A, PMHNP   2 mg at 08/16/24 1232   traZODone (DESYREL) tablet 50 mg  50 mg Oral QHS PRN Bobbitt, Shalon E, NP   50 mg at 08/15/24 2344    Lab Results:  Results for orders placed or performed during the hospital encounter of 08/12/24 (from the past 48 hours)  Lipid panel     Status: None   Collection Time: 08/16/24  6:19 AM  Result Value Ref Range   Cholesterol 172 0 - 200 mg/dL    Comment:        ATP III CLASSIFICATION:  <200     mg/dL    Desirable  799-760  mg/dL   Borderline High  >=759    mg/dL   High           Triglycerides 108 <150 mg/dL   HDL 54 >59 mg/dL   Total CHOL/HDL Ratio 3.2 RATIO   VLDL 22 0 - 40 mg/dL   LDL Cholesterol 96 0 - 99 mg/dL    Comment:        Total Cholesterol/HDL:CHD Risk Coronary Heart Disease Risk Table                     Men   Women  1/2 Average Risk   3.4   3.3  Average Risk       5.0   4.4  2 X Average Risk   9.6   7.1  3 X Average Risk  23.4   11.0        Use the calculated Patient Ratio above and the CHD Risk Table to determine the patient's CHD Risk.        ATP III CLASSIFICATION (LDL):  <100     mg/dL   Optimal  899-870  mg/dL   Near or Above                    Optimal  130-159  mg/dL   Borderline  839-810  mg/dL   High  >809     mg/dL   Very High Performed at [facility], [address], [place], [place] [address]   Hemoglobin A1c     Status: None   Collection Time: 08/16/24  6:19 AM  Result Value Ref Range   Hgb A1c MFr Bld 4.9 4.8 - 5.6 %    Comment: (NOTE) Diagnosis of Diabetes The following HbA1c ranges recommended by the American Diabetes Association (ADA) may be used as an aid in the diagnosis of diabetes  mellitus.  Hemoglobin             Suggested A1C NGSP%              Diagnosis  <5.7                   Non Diabetic  5.7-6.4                Pre-Diabetic  >6.4                   Diabetic  <7.0                   Glycemic control for                       adults with diabetes.     Mean Plasma Glucose 93.93 mg/dL    Comment: Performed at [facility], [address] [place], [place] [address]  RPR     Status: Abnormal   Collection Time: 08/16/24  6:19 AM  Result Value Ref Range   RPR Ser Ql Reactive (A) NON REACTIVE    Comment: SENT FOR CONFIRMATION   RPR Titer 1:2     Comment: Performed at [facility], [address] [place], [place] [address]    Blood Alcohol level:  Lab Results  Component Value Date   [facility]  <15 08/12/2024   ETH <11 08/02/2011    Metabolic Disorder Labs: Lab Results  Component Value Date   HGBA1C 4.9 08/16/2024   MPG 93.93 08/16/2024   No results found for: PROLACTIN Lab Results  Component Value Date   CHOL 172 08/16/2024   TRIG 108 08/16/2024   HDL 54 08/16/2024   CHOLHDL 3.2 08/16/2024   VLDL 22 08/16/2024   LDLCALC 96 08/16/2024    Physical Findings:   Psychiatric Specialty Exam: Physical Exam Constitutional:      Appearance: the patient is not toxic-appearing.  Pulmonary:     Effort: Pulmonary effort is normal.  Neurological:     General: No focal deficit present.     Mental Status: the patient is alert and oriented to person, place, and time.  Passive range of motion pain in bilateral shoulders. No cogwheeling throuhgout with passive range of motion. AIMS 1 for mouth movement distracted.   Review of Systems  Respiratory:  Negative for shortness of breath.   Cardiovascular:  Negative for chest pain.  Gastrointestinal:  Negative for abdominal pain, constipation, diarrhea, nausea and vomiting.  Neurological:  Negative for headaches.      BP 98/78 (BP Location: Left Arm)   Pulse 83   Temp 97.9 F (36.6 C) (Oral)   Resp 14   Ht 5' 1 (1.549 m)   Wt 59 kg   SpO2 100%   BMI 24.58 kg/m   General Appearance: Fairly Groomed  Eye Contact:  Good  Speech:  Clear and Coherent  Volume:  Normal  Mood: fine  Affect: congruent  Thought Process:  Coherent, linear  Orientation:  Full (Time, Place, and Person)  Thought Content: Logical, chronic delusions about being able to see dead people, stated the voices of the spirits that followed her to [place] woke her up last night  Suicidal Thoughts:  No  Homicidal Thoughts:  No  Memory:  Immediate;   Good  Judgement:  fair  Insight: fair  Psychomotor Activity:  normal, AIMS 1  Concentration:  Concentration: Good  Recall:  Good  Fund of Knowledge: Good  Language: Good  Akathisia:  No  Handed:  not assessed   AIMS (if indicated): not done  Assets:  Communication Skills Desire for Improvement Financial Resources/Insurance Housing Leisure Time Physical Health  ADL's:  Intact  Cognition: WNL  Sleep:  Fair      Treatment Plan Summary: Daily contact with patient to assess and evaluate symptoms and progress in treatment and Medication management  Assessment Principal Problem:   Severe stimulant use disorder (HCC) Active Problems:   GAD (generalized anxiety disorder)   Schizoaffective disorder (HCC)  Nancy Howell is a 48 year old female with a past psychiatric history of schizoaffective disorder bipolar type, GAD, and cocaine use disorder with recent cocaine use who presented to ED on 09/30 for suicidal ideation.  She is currently admitted on a voluntary basis to the behavioral health hospital.  Confirmed that patient received LAI on 9/15 which would put her LAI on 12/7 which is a Sunday so we will make it 12/8 so she can get on a Monday.  This is then put in her discharge order.  Patient continues to have some delusions (able to see dead people and believed that evil spirits told her from [place]  to [place] ), but no obvious hallucinations, paranoia, or disorganization today. She is stable to transfer to [facility] residential once accepted.  Patient will stay with sister moving forward but patient doing residential treatment is contingent on her going there.  Referral has been placed for patient to go to [facility] as this is her only option without insurance.  Other options are 1 year or longer.  Patient's shoulder pains may be tardive dyskinesia so may benefit from changing from paliperidone in the future but will continue for now and see if it improves with her normal benztropine dose.  Patient does have unspecified breast cancer diagnosis and some abnormal uterine bleeding which may be due to menopause but patient should follow-up PCP, so options were provided in her discharge paperwork.      Plan --Continue Invega Trinza 546 mg once every 12 weeks for schizoaffective; next dose on 10/20/2024 included in discharge orders --Consider oral antipsychotic if psychosis  - Continue Cogentin 2 mg twice daily, continue to monitor for EPS -- Follow-up lipid panel, A1c for antipsychotic labs --Follow-up RPR for medical rule out for psychosis -- Follow up on [facility] residential treatment; Inform sister about only option being [facility] given pt uninsured. If not accepted pt may have to return without residential treatment.      Alan Maiden, MD PGY-1 08/16/2024

## 2024-08-16 NOTE — Group Note (Signed)
 Date:  08/16/2024 Time:  9:36 AM  Group Topic/Focus:  Goals Group:   The focus of this group is to help patients establish daily goals to achieve during treatment and discuss how the patient can incorporate goal setting into their daily lives to aide in recovery. Orientation:   The focus of this group is to educate the patient on the purpose and policies of crisis stabilization and provide a format to answer questions about their admission.  The group details unit policies and expectations of patients while admitted.    Participation Level:  Did Not Attend  Nancy Howell 08/16/2024, 9:36 AM

## 2024-08-16 NOTE — Group Note (Signed)
 Date:  08/16/2024 Time:  10:16 AM  Group Topic/Focus: Social Wellness. Gratitude and Strengths  Psychoeducation on the benefits of gratitude and strengths-based thinking for social and emotional wellness. Guided group discussion: "What are you grateful for today?" Strengths identification activity: Participants listed 3 personal strengths and shared examples of when these were demonstrated. Partnered peer sharing to deepen connection and practice active listening. Group reflection: How gratitude and strengths can be used in daily life to improve social interactions.     Participation Level:  Did Not Attend   Nancy Howell R [name] 08/16/2024, 10:16 AM

## 2024-08-16 NOTE — Group Note (Unsigned)
 Date:  08/16/2024 Time:  8:42 PM  Group Topic/Focus:  Wrap-Up Group:   The focus of this group is to help patients review their daily goal of treatment and discuss progress on daily workbooks.     Participation Level:  {BHH PARTICIPATION OZCZO:77735}  Participation Quality:  {BHH PARTICIPATION QUALITY:22265}  Affect:  {BHH AFFECT:22266}  Cognitive:  {BHH COGNITIVE:22267}  Insight: {BHH Insight2:20797}  Engagement in Group:  {BHH ENGAGEMENT IN HMNLE:77731}  Modes of Intervention:  {BHH MODES OF INTERVENTION:22269}  Additional Comments:  ***  Bari Moats 08/16/2024, 8:42 PM

## 2024-08-16 NOTE — Plan of Care (Signed)
   Problem: Education: Goal: Knowledge of [facility] Education information/materials will improve Outcome: Progressing Goal: Emotional status will improve Outcome: Progressing

## 2024-08-16 NOTE — Progress Notes (Addendum)
 D. Pt has been calm and cooperative with no behavioral issues on the unit. Pt continues to complain of right shoulder pain upon movement, requiring prns for some relief. Pt has been isolated to her room for much of the shift due to not wanting to move her shoulder. Pt currently denies SI/HI and AVH and doesn't appear to be responding to internal stimuli. A. Labs and vitals monitored. Pt given and educated on medications. Pt supported emotionally and encouraged to express concerns and ask questions.   R. Pt remains safe with 15 minute checks. Will continue POC.    08/16/24 0900  Psych Admission Type (Psych Patients Only)  Admission Status Voluntary  Psychosocial Assessment  Patient Complaints Anxiety  Eye Contact Brief  Facial Expression Animated  Affect Anxious  Speech Logical/coherent  Interaction Childlike  Motor Activity Fidgety  Appearance/Hygiene Unremarkable  Behavior Characteristics Cooperative;Anxious  Mood Anxious  Thought Process  Coherency WDL  Content WDL  Delusions None reported or observed  Perception WDL  Hallucination None reported or observed  Judgment Poor  Confusion None  Danger to Self  Current suicidal ideation? Denies  Danger to Others  Danger to Others None reported or observed

## 2024-08-17 DIAGNOSIS — F1420 Cocaine dependence, uncomplicated: Secondary | ICD-10-CM | POA: Diagnosis not present

## 2024-08-17 DIAGNOSIS — R45851 Suicidal ideations: Secondary | ICD-10-CM | POA: Diagnosis not present

## 2024-08-17 DIAGNOSIS — F250 Schizoaffective disorder, bipolar type: Secondary | ICD-10-CM | POA: Diagnosis not present

## 2024-08-17 MED ORDER — LIDOCAINE 5 % EX PTCH
1.0000 | MEDICATED_PATCH | Freq: Every day | CUTANEOUS | Status: DC
  Administered 2024-08-17 – 2024-08-29 (×12): 1 via TRANSDERMAL
  Filled 2024-08-17: qty 7
  Filled 2024-08-17 (×16): qty 1

## 2024-08-17 MED ORDER — SALINE SPRAY 0.65 % NA SOLN: 1.0000 | NASAL | Status: DC | PRN

## 2024-08-17 NOTE — Progress Notes (Signed)
 [facility] MD Progress Note  08/17/2024 12:17 PM Nancy [facility]  MRN:  [id number]  Principal Problem: Severe stimulant use disorder (HCC) Diagnosis:  Principal Problem:   Severe stimulant use disorder (HCC) Active Problems:   GAD (generalized anxiety disorder)   Schizoaffective disorder (HCC)   Reason for Admission:  Nancy [facility] is a 48 year old female with a past psychiatric history of schizoaffective disorder bipolar type, GAD, and cocaine use disorder with recent cocaine use who presented to ED on 08/12/24 for suicidal ideation. She is currently admitted on a voluntary basis to the [facility].   Last 24 hours: Patient compliant with scheduled medications. PRNs taken include trazodone, vistaril, ibuprofen. Not attending groups.    Information Obtained Today During Patient Interview:  Patient reports doing well this morning. Denies AVH, SI. Appetite is fine. Feels her medications have helped a lot. Denies medication side effects. Patient said she slept okay last night. Continues to have R shoulder pain and is seen sleeping with her arm bent above her head. Informed patient that her RPR resulted reactive. She reports having past syphilis infection and being treated before. She is currently asymptomatic. Reactive result likely from past infection.  Total Time spent with patient: 20 min  Past Psychiatric History: Schizoaffective disorder, Bipolar I Disorder, Substance-induced mood disorder, GAD.  Stimulant use disorder.  Recent hospitalization at [place] [facility] where she was discharged on Invega Trinza 546 MG/1.75ML injection given on 07/28/2024 -- next dose 10/19/2024.   Past Medical History:  Past Medical History:  Diagnosis Date   Bipolar 1 disorder (HCC)    Schizo affective schizophrenia (HCC)     History reviewed. No pertinent surgical history. Family History:  History reviewed. No pertinent family history. Family Psychiatric  History: no pertinent.  Social History:  Social  History   Substance and Sexual Activity  Alcohol Use Yes   Comment: socially     Social History   Substance and Sexual Activity  Drug Use No    Social History   Socioeconomic History   Marital status: Single    Spouse name: Not on file   Number of children: Not on file   Years of education: Not on file   Highest education level: Not on file  Occupational History   Not on file  Tobacco Use   Smoking status: Every Day    Current packs/day: 0.50    Types: Cigarettes   Smokeless tobacco: Never  Substance and Sexual Activity   Alcohol use: Yes    Comment: socially   Drug use: No   Sexual activity: Not on file  Other Topics Concern   Not on file  Social History Narrative   Not on file   Social Drivers of Health   Financial Resource Strain: Not on file  Food Insecurity: No Food Insecurity (08/12/2024)   Hunger Vital Sign    Worried About Running Out of Food in the Last Year: Never true    Ran Out of Food in the Last Year: Never true  Transportation Needs: No Transportation Needs (08/12/2024)   PRAPARE - Administrator, Civil Service (Medical): No    Lack of Transportation (Non-Medical): No  Physical Activity: Not on file  Stress: Not on file  Social Connections: Not on file   Additional Social History:  She in unmarried and has no children. She reports having completed 10th grade, but then dropping out of school due to falling in with a bad crowd. She reports some moving back  and forth between [facility] and [place]. She denies any childhood trauma and reports a happy childhood in [place]. She says her father died of some cancer in 20, and her mother was murdered in 2010. She was previously unhoused in [place] . She will be living in [place] with her sister, Stephane, her sister's girlfriend, and her 2 kids. It is unclear if the patient is adopted, she mentioned living with an adoptive family but was unable to clarify further. She denies any legal issues or access to  firearms.    Current Medications: Current Facility-Administered Medications  Medication Dose Route Frequency Provider Last Rate Last Admin   acetaminophen (TYLENOL) tablet 1,000 mg  1,000 mg Oral Q6H PRN McCarty, Artie, MD   1,000 mg at 08/16/24 1941   alum & mag hydroxide-simeth (MAALOX/MYLANTA) 200-200-20 MG/5ML suspension 30 mL  30 mL Oral Q4H PRN Motley-Mangrum, Jadeka A, PMHNP       benztropine (COGENTIN) tablet 2 mg  2 mg Oral BID Marry Clamp, MD   2 mg at 08/17/24 9063   haloperidol  (HALDOL ) tablet 5 mg  5 mg Oral TID PRN Motley-Mangrum, Jadeka A, PMHNP       And   diphenhydrAMINE (BENADRYL) capsule 50 mg  50 mg Oral TID PRN Motley-Mangrum, Jadeka A, PMHNP       haloperidol  lactate (HALDOL ) injection 5 mg  5 mg Intramuscular TID PRN Motley-Mangrum, Jadeka A, PMHNP       And   diphenhydrAMINE (BENADRYL) injection 50 mg  50 mg Intramuscular TID PRN Motley-Mangrum, Jadeka A, PMHNP       And   LORazepam (ATIVAN) injection 2 mg  2 mg Intramuscular TID PRN Motley-Mangrum, Jadeka A, PMHNP       haloperidol  lactate (HALDOL ) injection 10 mg  10 mg Intramuscular TID PRN Motley-Mangrum, Jadeka A, PMHNP       And   diphenhydrAMINE (BENADRYL) injection 50 mg  50 mg Intramuscular TID PRN Motley-Mangrum, Jadeka A, PMHNP       And   LORazepam (ATIVAN) injection 2 mg  2 mg Intramuscular TID PRN Motley-Mangrum, Jadeka A, PMHNP       feeding supplement (ENSURE PLUS HIGH PROTEIN) liquid 237 mL  237 mL Oral BID BM Marry Clamp, MD   237 mL at 08/17/24 0936   fluticasone (FLONASE) 50 MCG/ACT nasal spray 1 spray  1 spray Each Nare Q12H PRN Towana Leita SAILOR, MD   1 spray at 08/16/24 1331   fluticasone (FLONASE) 50 MCG/ACT nasal spray 2 spray  2 spray Each Nare Daily McCarty, Artie, MD   2 spray at 08/17/24 0936   hydrOXYzine (ATARAX) tablet 25 mg  25 mg Oral TID PRN Motley-Mangrum, Jadeka A, PMHNP   25 mg at 08/16/24 2122   ibuprofen (ADVIL) tablet 600 mg  600 mg Oral Q6H PRN McCarty, Artie, MD   600  mg at 08/17/24 0320   magnesium hydroxide (MILK OF MAGNESIA) suspension 30 mL  30 mL Oral Daily PRN Motley-Mangrum, Jadeka A, PMHNP   30 mL at 08/13/24 2039   nicotine polacrilex (NICORETTE) gum 2 mg  2 mg Oral PRN Motley-Mangrum, Jadeka A, PMHNP   2 mg at 08/17/24 0955   traZODone (DESYREL) tablet 50 mg  50 mg Oral QHS PRN Bobbitt, Shalon E, NP   50 mg at 08/16/24 2122    Lab Results:  Results for orders placed or performed during the [facility] encounter of 08/12/24 (from the past 48 hours)  Lipid panel     Status:  None   Collection Time: 08/16/24  6:19 AM  Result Value Ref Range   Cholesterol 172 0 - 200 mg/dL    Comment:        ATP III CLASSIFICATION:  <200     mg/dL   Desirable  799-760  mg/dL   Borderline High  >=759    mg/dL   High           Triglycerides 108 <150 mg/dL   HDL 54 >59 mg/dL   Total CHOL/HDL Ratio 3.2 RATIO   VLDL 22 0 - 40 mg/dL   LDL Cholesterol 96 0 - 99 mg/dL    Comment:        Total Cholesterol/HDL:CHD Risk Coronary Heart Disease Risk Table                     Men   Women  1/2 Average Risk   3.4   3.3  Average Risk       5.0   4.4  2 X Average Risk   9.6   7.1  3 X Average Risk  23.4   11.0        Use the calculated Patient Ratio above and the CHD Risk Table to determine the patient's CHD Risk.        ATP III CLASSIFICATION (LDL):  <100     mg/dL   Optimal  899-870  mg/dL   Near or Above                    Optimal  130-159  mg/dL   Borderline  839-810  mg/dL   High  >809     mg/dL   Very High Performed at [facility], [address] [place], [place] [address]   Hemoglobin A1c     Status: None   Collection Time: 08/16/24  6:19 AM  Result Value Ref Range   Hgb A1c MFr Bld 4.9 4.8 - 5.6 %    Comment: (NOTE) Diagnosis of Diabetes The following HbA1c ranges recommended by the American Diabetes Association (ADA) may be used as an aid in the diagnosis of diabetes mellitus.  Hemoglobin             Suggested A1C NGSP%               Diagnosis  <5.7                   Non Diabetic  5.7-6.4                Pre-Diabetic  >6.4                   Diabetic  <7.0                   Glycemic control for                       adults with diabetes.     Mean Plasma Glucose 93.93 mg/dL    Comment: Performed at [facility], [address] [place], [place] [address]  RPR     Status: Abnormal   Collection Time: 08/16/24  6:19 AM  Result Value Ref Range   RPR Ser Ql Reactive (A) NON REACTIVE    Comment: SENT FOR CONFIRMATION   RPR Titer 1:2     Comment: Performed at [facility], [address] [place], [place] [address]  Blood Alcohol level:  Lab Results  Component Value Date   [facility] <15 08/12/2024   ETH <11 08/02/2011    Metabolic Disorder Labs: Lab Results  Component Value Date   HGBA1C 4.9 08/16/2024   MPG 93.93 08/16/2024   No results found for: PROLACTIN Lab Results  Component Value Date   CHOL 172 08/16/2024   TRIG 108 08/16/2024   HDL 54 08/16/2024   CHOLHDL 3.2 08/16/2024   VLDL 22 08/16/2024   LDLCALC 96 08/16/2024    Physical Findings:   Psychiatric Specialty Exam: Physical Exam Constitutional:      Appearance: the patient is not toxic-appearing.  Pulmonary:     Effort: Pulmonary effort is normal.  Neurological:     General: No focal deficit present.     Mental Status: the patient is alert and oriented to person, place, and time.  Passive range of motion pain in bilateral shoulders. No cogwheeling throuhgout with passive range of motion. AIMS 1 for mouth movement distracted.   Review of Systems  Respiratory:  Negative for shortness of breath.   Cardiovascular:  Negative for chest pain.  Gastrointestinal:  Negative for abdominal pain, constipation, diarrhea, nausea and vomiting.  Neurological:  Negative for headaches.      BP 119/77 (BP Location: Right Arm)   Pulse 87   Temp 97.9 F (36.6 C) (Oral)   Resp 14   Ht 5' 1 (1.549 m)   Wt 59 kg   SpO2 100%   BMI  24.58 kg/m   General Appearance: Fairly Groomed  Eye Contact:  Good  Speech:  Clear and Coherent  Volume:  Normal  Mood: fine  Affect: congruent  Thought Process:  Coherent, linear  Orientation:  Full (Time, Place, and Person)  Thought Content: Logical; no delusions or paranoia endorsed on intervierw  Suicidal Thoughts:  No  Homicidal Thoughts:  No  Memory:  Immediate;   Good  Judgement:  fair  Insight: fair  Psychomotor Activity:  normal, AIMS 1  Concentration:  Concentration: Good  Recall:  Good  Fund of Knowledge: Good  Language: Good  Akathisia:  No  Handed:  not assessed  AIMS (if indicated): not done  Assets:  Communication Skills Desire for Improvement Financial Resources/Insurance Housing Leisure Time Physical Health  ADL's:  Intact  Cognition: WNL  Sleep:  Fair    Treatment Plan Summary: Daily contact with patient to assess and evaluate symptoms and progress in treatment and Medication management  Assessment & Plan: Principal Problem:   Severe stimulant use disorder (HCC) Active Problems:   GAD (generalized anxiety disorder)   Schizoaffective disorder (HCC)  Caroljean Monsivais is a 48 year old female with a past psychiatric history of schizoaffective disorder bipolar type, GAD, and cocaine use disorder with recent cocaine use who presented to ED on 09/30 for suicidal ideation. She is currently admitted on a voluntary basis to the [facility].   Patient denies auditory hallucinations, does not appear objectively psychotic, and is not endorsing obvious delusions or hallucinations. Thought process is linear and coherent. She is stable to transfer to [facility] residential once accepted. Patient will stay with sister moving forward, but patient doing residential treatment is contingent on her going there. Referral has been placed for patient to go to [facility] as this is her only option without insurance as other options are 1 year or longer.  Patient still having R  shoulder pain, which has been going on for 2 weeks. Most likely underlying mechanical cause or overuse strain. Will  order lidocaine patch for pain. Patient RPR was reactive, however given past syphilis diagnosis and that patient is currently asymptomatic, RPR is likely reactive due to prior infection.   Plan -- Continue Invega Trinza 546 mg once every 12 weeks for schizoaffective; next dose on 10/20/2024 included in discharge orders - Continue Cogentin 2 mg twice daily, continue to monitor for EPS -- Start lidocaine patch for R shoulder pain -- Follow up on [facility] residential treatment; Inform sister about only option being [facility] given pt uninsured. If not accepted pt may have to return without residential treatment.    Ashley Gravely, MD, PGY-1 08/17/2024

## 2024-08-17 NOTE — Progress Notes (Signed)
   08/17/24 2130  Psych Admission Type (Psych Patients Only)  Admission Status Voluntary  Psychosocial Assessment  Patient Complaints Suspiciousness  Eye Contact Brief  Facial Expression Anxious  Affect Appropriate to circumstance  Speech Logical/coherent  Interaction Childlike  Motor Activity Fidgety  Appearance/Hygiene Poor hygiene  Behavior Characteristics Anxious  Mood Suspicious  Thought Process  Coherency WDL  Content WDL  Delusions None reported or observed  Perception WDL  Hallucination None reported or observed  Judgment Poor  Confusion None  Danger to Self  Current suicidal ideation? Denies  Danger to Others  Danger to Others None reported or observed

## 2024-08-17 NOTE — Group Note (Signed)
 Date:  08/17/2024 Time:  10:09 AM  Group Topic/Focus:  Goals Group:   The focus of this group is to help patients establish daily goals to achieve during treatment and discuss how the patient can incorporate goal setting into their daily lives to aide in recovery. Dimensions of Wellness:   The focus of this group is to introduce the topic of wellness and discuss the role each dimension of wellness plays in total health.  Participation Level:  Did Not Attend   [facility] 08/17/2024, 10:09 AM

## 2024-08-17 NOTE — Progress Notes (Signed)
 Tour of Duty:  Prentice JINNY Angle, RN, 08/17/24, Tour of Duty: 0700-1900  SI/HI/AVH: Denies  Self-Reported   Mood: Negative  Anxiety: Endorses Depression: Denies Irritability: Denies  Broset  Violence Prevention Guidelines *See Row Information*: Small Violence Risk interventions implemented   LBM  Last BM Date : 08/16/24   Pain: present, PRN provided (see MAR)  Patient Refusals (including Rx): No  >>Shift Summary: Patient observed to be childlike and needy on unit. Patient able to make needs known. Patient observed to engage appropriately with staff and peers. Patient taking medications as prescribed. This shift, PRN medication requested and administered. Patient frequently seeks out staff and PRN Rx for somatic concerns. No reported or observed side effects to medication. No reported or observed agitation, aggression, or other acute emotional distress. No new reported or observed physical abnormalities or concerns.  Last Vitals  Vitals Weight: 59 kg Temp: 97.9 F (36.6 C) Temp Source: Oral Pulse Rate: 97 Resp: 14 BP: 113/89 Patient Position: (not recorded)  Admission Type  Psych Admission Type (Psych Patients Only) Admission Status: Voluntary Date 72 hour document signed : (not recorded) Time 72 hour document signed : (not recorded) Provider Notified (First and Last Name) (see details for LINK to note): (not recorded)   Psychosocial Assessment  Psychosocial Assessment Patient Complaints: Anxiety Eye Contact: Avertive Facial Expression: Anxious Affect: Anxious Speech: Soft Interaction: Childlike, Needy Motor Activity: Restless Appearance/Hygiene: Unremarkable Behavior Characteristics: Cooperative, Anxious Mood: Anxious   Aggressive Behavior  Targets: (not recorded)   Thought Process  Thought Process Coherency: Within Defined Limits Content: Within Defined Limits Delusions: None reported or observed Perception: Within Defined Limits Hallucination:  None reported or observed Judgment: Poor Confusion: None  Danger to Self/Others  Danger to Self Current suicidal ideation?: Denies Description of Suicide Plan: (not recorded) Self-Injurious Behavior: (not recorded) Agreement Not to Harm Self: (not recorded) Description of Agreement: (not recorded) Danger to Others: None reported or observed

## 2024-08-17 NOTE — [facility] Group Notes (Signed)
 Psychoeducational Group Note  Date:  08/17/2024 Time:  2000  Group Topic/Focus:  Wrap up group  Participation Level: Did Not Attend  Participation Quality:  Not Applicable  Affect:  Not Applicable  Cognitive:  Not Applicable  Insight:  Not Applicable  Engagement in Group: Not Applicable  Additional Comments:  Did not attend.   Lenora Manuelita RAMAN 08/17/2024, 8:58 PM

## 2024-08-17 NOTE — Plan of Care (Signed)
  Problem: Education: Goal: Mental status will improve Outcome: Not Progressing   Problem: Activity: Goal: Interest or engagement in activities will improve Outcome: Not Progressing   Problem: Coping: Goal: Ability to demonstrate self-control will improve Outcome: Not Progressing

## 2024-08-17 NOTE — Group Note (Signed)
 Date:  08/17/2024 Time:  6:41 PM  Group Topic/Focus:   Group used a non- competitive card game to build mindful listening skills and encourage acceptance and understanding of oneself and peers. Patients shared, fostering empathy and personal growth in a supportive environment.    Participation Level:  Did Not Attend   Berwyn GORMAN Acosta 08/17/2024, 6:41 PM

## 2024-08-17 NOTE — Plan of Care (Signed)
   Problem: Education: Goal: Emotional status will improve Outcome: Not Progressing Goal: Mental status will improve Outcome: Not Progressing

## 2024-08-17 NOTE — Group Note (Signed)
 Date:  08/17/2024 Time:  10:31 AM  Group Topic/Focus:  Emotional Education:   The focus of this group is to discuss what feelings/emotions are, and how they are experienced.    Participation Level:  Did Not Attend   N[facility] 08/17/2024, 10:31 AM

## 2024-08-17 NOTE — Progress Notes (Signed)
(  Sleep Hours) -6.75  (Any PRNs that were needed, meds refused, or side effects to meds)- tylenol,vistaril,gum, ibuprofen and trazodone (Any disturbances and when (visitation, over night)-none (Concerns raised by the patient)- pt complained of right shoulder pain which she rated a 10. (SI/HI/AVH)- denies all

## 2024-08-18 ENCOUNTER — Encounter (HOSPITAL_COMMUNITY): Payer: Self-pay

## 2024-08-18 DIAGNOSIS — R45851 Suicidal ideations: Secondary | ICD-10-CM | POA: Diagnosis not present

## 2024-08-18 DIAGNOSIS — F1420 Cocaine dependence, uncomplicated: Secondary | ICD-10-CM | POA: Diagnosis not present

## 2024-08-18 DIAGNOSIS — F250 Schizoaffective disorder, bipolar type: Secondary | ICD-10-CM | POA: Diagnosis not present

## 2024-08-18 LAB — T.PALLIDUM AB, TOTAL: T Pallidum Abs: REACTIVE — AB

## 2024-08-18 NOTE — Plan of Care (Signed)
   Problem: Activity: Goal: Interest or engagement in activities will improve Outcome: Progressing   Problem: Coping: Goal: Ability to verbalize frustrations and anger appropriately will improve Outcome: Progressing   Problem: Safety: Goal: Periods of time without injury will increase Outcome: Progressing

## 2024-08-18 NOTE — Progress Notes (Signed)
 [facility] MD Progress Note  08/18/2024 11:02 AM Nancy Howell  MRN:  [id number]  Principal Problem: Severe stimulant use disorder (HCC) Diagnosis:  Principal Problem:   Severe stimulant use disorder (HCC) Active Problems:   GAD (generalized anxiety disorder)   Schizoaffective disorder (HCC)   Reason for Admission:  Nancy Howell is a 48 year old female with a past psychiatric history of schizoaffective disorder bipolar type, GAD, and cocaine use disorder with recent cocaine use who presented to ED on 08/12/24 for suicidal ideation. She is currently admitted on a voluntary basis to the behavioral health hospital.   Last 24 hours: Patient compliant with scheduled medications. PRNs taken include trazodone, atarax, ibuprofen, nicotine gum. Not attending groups.    Information Obtained Today During Patient Interview:  Patient reports doing well this morning. Denies AVH, SI. Appetite is fine. Feels her medications have helped a lot. Denies medication side effects. Patient said she slept okay last night. Continues to have R shoulder pain though states lidocaine patches are helping.   Total Time spent with patient: 20 min  Past Psychiatric History: Schizoaffective disorder, Bipolar I Disorder, Substance-induced mood disorder, GAD.  Stimulant use disorder.  Recent hospitalization at [facility] [place] [facility] where she was discharged on Invega Trinza 546 MG/1.75ML injection given on 07/28/2024 -- next dose 10/19/2024.   Past Medical History:  Past Medical History:  Diagnosis Date   Bipolar 1 disorder (HCC)    Schizo affective schizophrenia (HCC)     History reviewed. No pertinent surgical history. Family History:  History reviewed. No pertinent family history. Family Psychiatric  History: no pertinent.  Social History:  Social History   Substance and Sexual Activity  Alcohol Use Yes   Comment: socially     Social History   Substance and Sexual Activity  Drug Use No    Social History   Socioeconomic  History   Marital status: Single    Spouse name: Not on file   Number of children: Not on file   Years of education: Not on file   Highest education level: Not on file  Occupational History   Not on file  Tobacco Use   Smoking status: Every Day    Current packs/day: 0.50    Types: Cigarettes   Smokeless tobacco: Never  Substance and Sexual Activity   Alcohol use: Yes    Comment: socially   Drug use: No   Sexual activity: Not on file  Other Topics Concern   Not on file  Social History Narrative   Not on file   Social Drivers of Health   Financial Resource Strain: Not on file  Food Insecurity: No Food Insecurity (08/12/2024)   Hunger Vital Sign    Worried About Running Out of Food in the Last Year: Never true    Ran Out of Food in the Last Year: Never true  Transportation Needs: No Transportation Needs (08/12/2024)   PRAPARE - Administrator, Civil Service (Medical): No    Lack of Transportation (Non-Medical): No  Physical Activity: Not on file  Stress: Not on file  Social Connections: Not on file   Additional Social History:  She in unmarried and has no children. She reports having completed 10th grade, but then dropping out of school due to falling in with a bad crowd. She reports some moving back and forth between  and [place]. She denies any childhood trauma and reports a happy childhood in [place]. She says her father died of some cancer in 9,  and her mother was murdered in 2010. She was previously unhoused in [place] . She will be living in [place] with her sister, Nancy Howell, her sister's girlfriend, and her 2 kids. It is unclear if the patient is adopted, she mentioned living with an adoptive family but was unable to clarify further. She denies any legal issues or access to firearms.    Current Medications: Current Facility-Administered Medications  Medication Dose Route Frequency Provider Last Rate Last Admin   acetaminophen (TYLENOL) tablet 1,000 mg   1,000 mg Oral Q6H PRN McCarty, Artie, MD   1,000 mg at 08/16/24 1941   alum & mag hydroxide-simeth (MAALOX/MYLANTA) 200-200-20 MG/5ML suspension 30 mL  30 mL Oral Q4H PRN Motley-Mangrum, Jadeka A, PMHNP       benztropine (COGENTIN) tablet 2 mg  2 mg Oral BID Marry Clamp, MD   2 mg at 08/18/24 9061   haloperidol  (HALDOL ) tablet 5 mg  5 mg Oral TID PRN Motley-Mangrum, Jadeka A, PMHNP       And   diphenhydrAMINE (BENADRYL) capsule 50 mg  50 mg Oral TID PRN Motley-Mangrum, Jadeka A, PMHNP       haloperidol  lactate (HALDOL ) injection 5 mg  5 mg Intramuscular TID PRN Motley-Mangrum, Jadeka A, PMHNP       And   diphenhydrAMINE (BENADRYL) injection 50 mg  50 mg Intramuscular TID PRN Motley-Mangrum, Jadeka A, PMHNP       And   LORazepam (ATIVAN) injection 2 mg  2 mg Intramuscular TID PRN Motley-Mangrum, Jadeka A, PMHNP       haloperidol  lactate (HALDOL ) injection 10 mg  10 mg Intramuscular TID PRN Motley-Mangrum, Jadeka A, PMHNP       And   diphenhydrAMINE (BENADRYL) injection 50 mg  50 mg Intramuscular TID PRN Motley-Mangrum, Jadeka A, PMHNP       And   LORazepam (ATIVAN) injection 2 mg  2 mg Intramuscular TID PRN Motley-Mangrum, Jadeka A, PMHNP       feeding supplement (ENSURE PLUS HIGH PROTEIN) liquid 237 mL  237 mL Oral BID BM Marry Clamp, MD   237 mL at 08/18/24 0938   fluticasone (FLONASE) 50 MCG/ACT nasal spray 1 spray  1 spray Each Nare Q12H PRN Towana Leita SAILOR, MD   1 spray at 08/16/24 1331   fluticasone (FLONASE) 50 MCG/ACT nasal spray 2 spray  2 spray Each Nare Daily McCarty, Artie, MD   2 spray at 08/18/24 0935   hydrOXYzine (ATARAX) tablet 25 mg  25 mg Oral TID PRN Motley-Mangrum, Jadeka A, PMHNP   25 mg at 08/17/24 2058   ibuprofen (ADVIL) tablet 600 mg  600 mg Oral Q6H PRN McCarty, Artie, MD   600 mg at 08/18/24 0637   lidocaine (LIDODERM) 5 % 1 patch  1 patch Transdermal Daily Mannie Ashley SAILOR, MD   1 patch at 08/18/24 0937   magnesium hydroxide (MILK OF MAGNESIA) suspension 30  mL  30 mL Oral Daily PRN Motley-Mangrum, Jadeka A, PMHNP   30 mL at 08/13/24 2039   nicotine polacrilex (NICORETTE) gum 2 mg  2 mg Oral PRN Motley-Mangrum, Jadeka A, PMHNP   2 mg at 08/18/24 0626   sodium chloride (OCEAN) 0.65 % nasal spray 1 spray  1 spray Each Nare PRN Towana Leita SAILOR, MD       traZODone (DESYREL) tablet 50 mg  50 mg Oral QHS PRN Bobbitt, Shalon E, NP   50 mg at 08/17/24 2058    Lab Results:  No results found for this  or any previous visit (from the past 48 hours).   Blood Alcohol level:  Lab Results  Component Value Date   [facility] <15 08/12/2024   ETH <11 08/02/2011    Metabolic Disorder Labs: Lab Results  Component Value Date   HGBA1C 4.9 08/16/2024   MPG 93.93 08/16/2024   No results found for: PROLACTIN Lab Results  Component Value Date   CHOL 172 08/16/2024   TRIG 108 08/16/2024   HDL 54 08/16/2024   CHOLHDL 3.2 08/16/2024   VLDL 22 08/16/2024   LDLCALC 96 08/16/2024    Physical Findings:   Psychiatric Specialty Exam: Physical Exam Constitutional:      Appearance: the patient is not toxic-appearing.  Pulmonary:     Effort: Pulmonary effort is normal.  Neurological:     General: No focal deficit present.     Mental Status: the patient is alert and oriented to person, place, and time.  Passive range of motion pain in bilateral shoulders. No cogwheeling throuhgout with passive range of motion. AIMS 1 for mouth movement distracted.   Review of Systems  Respiratory:  Negative for shortness of breath.   Cardiovascular:  Negative for chest pain.  Gastrointestinal:  Negative for abdominal pain, constipation, diarrhea, nausea and vomiting.  Neurological:  Negative for headaches.      BP (!) 122/95 (BP Location: Right Arm)   Pulse 87   Temp 97.9 F (36.6 C) (Oral)   Resp 18   Ht 5' 1 (1.549 m)   Wt 59 kg   SpO2 100%   BMI 24.58 kg/m   General Appearance: Fairly Groomed  Eye Contact:  Good  Speech:  Clear and Coherent  Volume:  Normal   Mood: fine  Affect: congruent  Thought Process:  Coherent, linear  Orientation:  Full (Time, Place, and Person)  Thought Content: Logical; no delusions or paranoia endorsed on interview  Suicidal Thoughts:  No  Homicidal Thoughts:  No  Memory:  Immediate;   Good  Judgement:  fair  Insight: fair  Psychomotor Activity:  normal, AIMS 1  Concentration:  Concentration: Good  Recall:  Good  Fund of Knowledge: Good  Language: Good  Akathisia:  No  Handed:  not assessed  AIMS (if indicated): not done  Assets:  Communication Skills Desire for Improvement Financial Resources/Insurance Housing Leisure Time Physical Health  ADL's:  Intact  Cognition: WNL  Sleep:  Fair    Treatment Plan Summary: Daily contact with patient to assess and evaluate symptoms and progress in treatment and Medication management  Assessment & Plan: Principal Problem:   Severe stimulant use disorder (HCC) Active Problems:   GAD (generalized anxiety disorder)   Schizoaffective disorder (HCC)  Nancy Howell is a 48 year old female with a past psychiatric history of schizoaffective disorder bipolar type, GAD, and cocaine use disorder with recent cocaine use who presented to ED on 09/30 for suicidal ideation. She is currently admitted on a voluntary basis to the [facility].   Patient denies auditory hallucinations, does not appear objectively psychotic, and is not endorsing obvious delusions or hallucinations. Thought process is linear and coherent. She is stable to transfer to [facility] residential once accepted. Patient will stay with sister moving forward, but patient doing residential treatment is contingent on her going there. Referral has been placed for patient to go to [facility] as this is her only option without insurance as other options are 1 year or longer.  Patient still having R shoulder pain, which has been going on for  2 weeks. Most likely underlying mechanical cause or overuse strain. Patient  continues to be stable as we await rehab placement.   Plan -- Continue Invega Trinza 546 mg once every 12 weeks for schizoaffective; next dose on 10/20/2024 included in discharge orders - Continue Cogentin 2 mg twice daily, continue to monitor for EPS -- Continue lidocaine patch for R shoulder pain -- Follow up on [facility] residential treatment; Inform sister about only option being [facility] given pt uninsured. If not accepted pt may have to return without residential treatment.    Ashley Gravely, MD, PGY-1 08/17/2024

## 2024-08-18 NOTE — Progress Notes (Signed)
(  Sleep Hours) -6.75e needed, meds refused, or side effects to meds)- vistaril, trazodone, ibuprofen, gum (Any disturbances and when (visitation, over night)- none (Concerns raised by the patient)- c/o rt shoulder pain (SI/HI/AVH)- denies

## 2024-08-18 NOTE — BH IP Treatment Plan (Signed)
 Interdisciplinary Treatment and Diagnostic Plan Update  08/18/2024 Time of Session: 12:05 PM - UPDATE Nancy Howell MRN: [id number]  Principal Diagnosis: Severe stimulant use disorder (HCC)  Secondary Diagnoses: Principal Problem:   Severe stimulant use disorder (HCC) Active Problems:   GAD (generalized anxiety disorder)   Schizoaffective disorder (HCC)   Current Medications:  Current Facility-Administered Medications  Medication Dose Route Frequency Provider Last Rate Last Admin   acetaminophen (TYLENOL) tablet 1,000 mg  1,000 mg Oral Q6H PRN McCarty, Artie, MD   1,000 mg at 08/16/24 1941   alum & mag hydroxide-simeth (MAALOX/MYLANTA) 200-200-20 MG/5ML suspension 30 mL  30 mL Oral Q4H PRN Motley-Mangrum, Jadeka A, PMHNP       benztropine (COGENTIN) tablet 2 mg  2 mg Oral BID Marry Clamp, MD   2 mg at 08/18/24 9061   haloperidol  (HALDOL ) tablet 5 mg  5 mg Oral TID PRN Motley-Mangrum, Jadeka A, PMHNP       And   diphenhydrAMINE (BENADRYL) capsule 50 mg  50 mg Oral TID PRN Motley-Mangrum, Jadeka A, PMHNP       haloperidol  lactate (HALDOL ) injection 5 mg  5 mg Intramuscular TID PRN Motley-Mangrum, Jadeka A, PMHNP       And   diphenhydrAMINE (BENADRYL) injection 50 mg  50 mg Intramuscular TID PRN Motley-Mangrum, Jadeka A, PMHNP       And   LORazepam (ATIVAN) injection 2 mg  2 mg Intramuscular TID PRN Motley-Mangrum, Jadeka A, PMHNP       haloperidol  lactate (HALDOL ) injection 10 mg  10 mg Intramuscular TID PRN Motley-Mangrum, Jadeka A, PMHNP       And   diphenhydrAMINE (BENADRYL) injection 50 mg  50 mg Intramuscular TID PRN Motley-Mangrum, Jadeka A, PMHNP       And   LORazepam (ATIVAN) injection 2 mg  2 mg Intramuscular TID PRN Motley-Mangrum, Jadeka A, PMHNP       feeding supplement (ENSURE PLUS HIGH PROTEIN) liquid 237 mL  237 mL Oral BID BM Marry Clamp, MD   237 mL at 08/18/24 1553   fluticasone (FLONASE) 50 MCG/ACT nasal spray 1 spray  1 spray Each Nare Q12H PRN Towana Leita SAILOR,  MD   1 spray at 08/16/24 1331   fluticasone (FLONASE) 50 MCG/ACT nasal spray 2 spray  2 spray Each Nare Daily McCarty, Artie, MD   2 spray at 08/18/24 0935   hydrOXYzine (ATARAX) tablet 25 mg  25 mg Oral TID PRN Motley-Mangrum, Jadeka A, PMHNP   25 mg at 08/17/24 2058   ibuprofen (ADVIL) tablet 600 mg  600 mg Oral Q6H PRN McCarty, Artie, MD   600 mg at 08/18/24 1531   lidocaine (LIDODERM) 5 % 1 patch  1 patch Transdermal Daily Mannie Ashley SAILOR, MD   1 patch at 08/18/24 0937   magnesium hydroxide (MILK OF MAGNESIA) suspension 30 mL  30 mL Oral Daily PRN Motley-Mangrum, Jadeka A, PMHNP   30 mL at 08/13/24 2039   nicotine polacrilex (NICORETTE) gum 2 mg  2 mg Oral PRN Motley-Mangrum, Jadeka A, PMHNP   2 mg at 08/18/24 1554   sodium chloride (OCEAN) 0.65 % nasal spray 1 spray  1 spray Each Nare PRN Towana Leita SAILOR, MD       traZODone (DESYREL) tablet 50 mg  50 mg Oral QHS PRN Bobbitt, Shalon E, NP   50 mg at 08/17/24 2058   PTA Medications: Medications Prior to Admission  Medication Sig Dispense Refill Last Dose/Taking   benztropine (COGENTIN) 2 MG  tablet Take 2 mg by mouth 2 (two) times daily.   08/13/2024 Morning   diphenhydrAMINE (BENADRYL) 25 mg capsule Take 25 mg by mouth daily.   08/12/2024   Paliperidone Palmitate (INVEGA SUSTENNA IM) Inject into the muscle. Pt received at [facility] [phone] pharmcacy refused to provide dosing and administration information   Taking    Patient Stressors: Substance abuse    Patient Strengths: Supportive family/friends   Treatment Modalities: Medication Management, Group therapy, Case management,  1 to 1 session with clinician, Psychoeducation, Recreational therapy.   Physician Treatment Plan for Primary Diagnosis: Severe stimulant use disorder (HCC) Long Term Goal(s):     Short Term Goals:    Medication Management: Evaluate patient's response, side effects, and tolerance of medication regimen.  Therapeutic Interventions: 1 to 1  sessions, Unit Group sessions and Medication administration.  Evaluation of Outcomes: Progressing  Physician Treatment Plan for Secondary Diagnosis: Principal Problem:   Severe stimulant use disorder (HCC) Active Problems:   GAD (generalized anxiety disorder)   Schizoaffective disorder (HCC)  Long Term Goal(s):     Short Term Goals:       Medication Management: Evaluate patient's response, side effects, and tolerance of medication regimen.  Therapeutic Interventions: 1 to 1 sessions, Unit Group sessions and Medication administration.  Evaluation of Outcomes: Progressing   RN Treatment Plan for Primary Diagnosis: Severe stimulant use disorder (HCC) Long Term Goal(s): Knowledge of disease and therapeutic regimen to maintain health will improve  Short Term Goals: Ability to remain free from injury will improve, Ability to verbalize frustration and anger appropriately will improve, Ability to verbalize feelings will improve, and Ability to disclose and discuss suicidal ideas  Medication Management: RN will administer medications as ordered by provider, will assess and evaluate patient's response and provide education to patient for prescribed medication. RN will report any adverse and/or side effects to prescribing provider.  Therapeutic Interventions: 1 on 1 counseling sessions, Psychoeducation, Medication administration, Evaluate responses to treatment, Monitor vital signs and CBGs as ordered, Perform/monitor CIWA, COWS, AIMS and Fall Risk screenings as ordered, Perform wound care treatments as ordered.  Evaluation of Outcomes: Progressing   LCSW Treatment Plan for Primary Diagnosis: Severe stimulant use disorder (HCC) Long Term Goal(s): Safe transition to appropriate next level of care at discharge, Engage patient in therapeutic group addressing interpersonal concerns.  Short Term Goals: Engage patient in aftercare planning with referrals and resources, Increase ability to  appropriately verbalize feelings, Facilitate acceptance of mental health diagnosis and concerns, and Identify triggers associated with mental health/substance abuse issues  Therapeutic Interventions: Assess for all discharge needs, 1 to 1 time with Social worker, Explore available resources and support systems, Assess for adequacy in community support network, Educate family and significant other(s) on suicide prevention, Complete Psychosocial Assessment, Interpersonal group therapy.  Evaluation of Outcomes: Progressing   Progress in Treatment: Attending groups: No Participating in groups: No Taking medication as prescribed:  Yes Toleration medication:  Yes Family/Significant other contact made: No, will contact:  Sister, [name] [phone].  First attempt: 08/13/24 @ 350PM.  Second attempt:  second attempt: 08/14/24 @ 150PM.   Patient understands diagnosis: Yes. Discussing patient identified problems/goals with staff: Yes. Medical problems stabilized or resolved: Yes. Denies suicidal/homicidal ideation: Yes. Issues/concerns per patient self-inventory: No.   New problem(s) identified: No, Describe:  none reported    New Short Term/Long Term Goal(s): detox, medication management for mood stabilization; elimination of SI thoughts; development of comprehensive mental wellness/sobriety plan   Patient Goals:  Figure out my SSI and possibly go to inpatient rehab   Discharge Plan or Barriers: Patient recently admitted. CSW will continue to follow and assess for appropriate referrals and possible discharge planning.      Reason for Continuation of Hospitalization: Anxiety Medication stabilization Suicidal ideation   Estimated Length of Stay: 4 - 6 days  Last 3 [place] Suicide Severity Risk Score: Flowsheet Row [facility] Most recent reading at 08/12/2024  9:00 PM ED from 08/12/2024 in [facility] Emergency  [facility] at [facility] Most recent reading at 08/12/2024  9:54 AM  C-SSRS RISK CATEGORY Low Risk Low Risk    Last PHQ 2/9 Scores:     No data to display          Scribe for Treatment Team: Blain Hunsucker O Diane Mochizuki, LCSWA 08/18/2024 6:02 PM

## 2024-08-18 NOTE — Group Note (Signed)
 Date:  08/18/2024 Time:  9:57 AM  Group Topic/Focus:  Goals Group:   The focus of this group is to help patients establish daily goals to achieve during treatment and discuss how the patient can incorporate goal setting into their daily lives to aide in recovery.    Participation Level:  Did Not Attend   Denisia Harpole R Joie Reamer 08/18/2024, 9:57 AM

## 2024-08-18 NOTE — Progress Notes (Signed)
   08/18/24 2100  Psych Admission Type (Psych Patients Only)  Admission Status Voluntary  Psychosocial Assessment  Patient Complaints None  Eye Contact Brief  Facial Expression Anxious  Affect Appropriate to circumstance  Speech Soft  Interaction Childlike;Needy  Motor Activity Fidgety  Appearance/Hygiene Disheveled  Behavior Characteristics Cooperative  Mood Anxious  Thought Process  Coherency WDL  Content WDL  Delusions None reported or observed  Perception WDL  Hallucination None reported or observed  Judgment Poor  Confusion None  Danger to Self  Current suicidal ideation? Denies  Agreement Not to Harm Self Yes  Description of Agreement Verbal  Danger to Others  Danger to Others None reported or observed

## 2024-08-18 NOTE — Group Note (Signed)
 Recreation Therapy Group Note   Group Topic:Stress Management  Group Date: 08/18/2024 Start Time: 0930 End Time: 0950 Facilitators: Venna Berberich-McCall, LRT,CTRS Location: [address] Dayroom   Group Topic: Stress Management   Goal Area(s) Addresses:  Patient will actively participate in stress management techniques presented during session.  Patient will successfully identify benefit of practicing stress management post d/c.   Behavioral Response:   Intervention: Relaxation exercise with ambient sound and script   Activity: Guided Imagery. LRT provided education, instruction, and demonstration on practice of visualization via guided imagery. Patient was asked to participate in the technique introduced during session. LRT debriefed including topics of mindfulness, stress management and specific scenarios each patient could use these techniques. Patients were given suggestions of ways to access scripts post d/c and encouraged to explore Youtube and other apps available on smartphones, tablets, and computers.  Education:  Stress Management, Discharge Planning.   Education Outcome: Acknowledges education   Affect/Mood: N/A   Participation Level: Did not attend    Clinical Observations/Individualized Feedback:      Plan: Continue to engage patient in RT group sessions 2-3x/week.   Dorthia Tout-McCall, LRT,CTRS  08/18/2024 12:16 PM

## 2024-08-18 NOTE — Group Note (Signed)
 Date:  08/18/2024 Time:  3:06 PM  Group Topic/Focus:  Emotional Education:   The focus of this group is to discuss what feelings/emotions are, and how they are experienced. Physical Wellness Education: To educate participants on the principles of physical wellness, promote healthy lifestyle habits, and encourage regular physical activity to improve overall physical health, energy levels, and quality of life.   Participation Level:  Did Not Attend   Sherrel Shafer R Makilah Dowda 08/18/2024, 3:06 PM

## 2024-08-18 NOTE — Progress Notes (Signed)
   08/18/24 0800  Psych Admission Type (Psych Patients Only)  Admission Status Voluntary  Psychosocial Assessment  Patient Complaints Suspiciousness  Eye Contact Brief  Facial Expression Anxious  Affect Appropriate to circumstance  Speech Logical/coherent  Interaction Childlike  Motor Activity Fidgety  Appearance/Hygiene Improved  Behavior Characteristics Cooperative;Appropriate to situation  Mood Anxious  Aggressive Behavior  Effect No apparent injury  Thought Process  Coherency WDL  Content WDL  Delusions None reported or observed  Perception WDL  Hallucination None reported or observed  Judgment Poor  Confusion None  Danger to Self  Current suicidal ideation? Denies  Danger to Others  Danger to Others None reported or observed

## 2024-08-18 NOTE — Plan of Care (Signed)
   Problem: Education: Goal: Emotional status will improve Outcome: Progressing Goal: Mental status will improve Outcome: Progressing Goal: Verbalization of understanding the information provided will improve Outcome: Progressing

## 2024-08-18 NOTE — Progress Notes (Signed)
 Collateral contact - [address], [address] [place], [place] [address], [phone]  CSW called and was informed that CSW will receive a phone call re:  the referral that was sent on Friday, 10/3.   Beyla Loney, LCSWA 08/18/2024

## 2024-08-18 NOTE — [facility] Group Notes (Signed)
 [facility] Group Notes:  (Nursing/MHT/Case Management/Adjunct)  Date:  08/18/2024  Time:  8:48 PM  Type of Therapy:  Wrap-up group  Participation Level:  Did Not Attend  Participation Quality:    Affect:    Cognitive:    Insight:    Engagement in Group:    Modes of Intervention:    Summary of Progress/Problems:PT refused to attend group.  Nancy Howell 08/18/2024, 8:48 PM

## 2024-08-19 DIAGNOSIS — F411 Generalized anxiety disorder: Secondary | ICD-10-CM

## 2024-08-19 DIAGNOSIS — F1520 Other stimulant dependence, uncomplicated: Secondary | ICD-10-CM

## 2024-08-19 DIAGNOSIS — F259 Schizoaffective disorder, unspecified: Secondary | ICD-10-CM | POA: Diagnosis not present

## 2024-08-19 MED ORDER — HALOPERIDOL 5 MG PO TABS
5.0000 mg | ORAL_TABLET | Freq: Two times a day (BID) | ORAL | Status: DC
  Filled 2024-08-19: qty 1

## 2024-08-19 NOTE — [facility] Group Notes (Signed)
 Spirituality Group   Group Goal: Support / Education around grief and loss   Group Description: Following introductions and group rules, group members engaged in facilitated group dialog and support around topic of loss, with particular support around experiences of loss in their lives. Group members identified types of loss (relationships / self / things) as well as patterns, circumstances, and changes that precipitate loss. Reflection invited on thoughts / feelings around loss, normalized grief responses, and recognized variety in grief experience. Group noted Worden's four tasks of grief in discussion. Group drew on Adlerian / Rogerian, narrative, MI, with Yalom's group therapy as a primary framework.   Observations: Maila was engaged in the group discussion. Pain in arm was distracting from focus.  Chenay Nesmith L. Delores HERO.Div

## 2024-08-19 NOTE — Group Note (Signed)
 LCSW Group Therapy Note   Group Date: 08/19/2024 Start Time: 1100 End Time: 1200   Participation:  did not attend  Type of Therapy:  Group Therapy  Topic:  Stress Less:  Nurturing Your Mind and Body Through Calm    Objective:  Learn techniques for managing stress through body relaxation, mindfulness, and self-compassion.  Goals: Use body relaxation techniques, such as Box Breathing and Progressive Muscle Relaxation, to reduce physical tension. Practice mindfulness to break the cycle of overthinking and mental chatter. Embrace self-compassion to handle stress with kindness and resilience.  Summary:  Today's session focused on calming the body with relaxation techniques, breaking the cycle of stress with mindfulness, and using self-compassion to manage challenges more gracefully. These tools help reduce stress and foster a balanced, peaceful mindset.  Therapeutic Modalities used:  Elements of CBT ( cognitive restructuring)  Elements of DBT (box breathing, progressive body relaxation, mindfulness, acceptance)    Monroe Toure O Rhet Rorke, LCSWA 08/19/2024  4:55 PM

## 2024-08-19 NOTE — Progress Notes (Signed)
 Patient is pacing the hallway and responding to internal stimuli. She is pacing and talking about a boa snake around her neck and that her sister is waiting on her to die because she is the beneficiary. She reported that her kids are in [place]  at the penthouse. She came to this nurse and asked for something to help her from feeling crazy and placed her hands on her head. Agitation medication given.

## 2024-08-19 NOTE — Plan of Care (Signed)
  Problem: Activity: Goal: Interest or engagement in activities will improve Outcome: Progressing Goal: Sleeping patterns will improve Outcome: Progressing   Problem: Coping: Goal: Ability to verbalize frustrations and anger appropriately will improve Outcome: Progressing Goal: Ability to demonstrate self-control will improve Outcome: Progressing   Problem: Safety: Goal: Periods of time without injury will increase Outcome: Progressing

## 2024-08-19 NOTE — Progress Notes (Signed)
   08/19/24 1300  Psych Admission Type (Psych Patients Only)  Admission Status Voluntary  Psychosocial Assessment  Patient Complaints None  Eye Contact Brief  Facial Expression Anxious  Affect Appropriate to circumstance  Speech Logical/coherent  Interaction Childlike  Motor Activity Fidgety  Appearance/Hygiene Improved  Behavior Characteristics Cooperative;Appropriate to situation  Mood Anxious  Thought Process  Coherency WDL  Content WDL  Delusions None reported or observed  Perception WDL  Hallucination Auditory (Pt denies, staff observed pt talking to self as if responding to internal stimuli.)  Judgment Poor  Confusion None  Danger to Self  Current suicidal ideation? Denies  Agreement Not to Harm Self Yes  Description of Agreement Verbal  Danger to Others  Danger to Others None reported or observed

## 2024-08-19 NOTE — Progress Notes (Addendum)
 Nancy Howell   Type of Note: [employer] and spoke with [place] in admissions who reports pt was declined due to not meeting criteria. [place] unable to give specifics and transferred this Clinical research associate to clinical team who did not answer. Voicemail left requesting additional information.  Will update.  11:25 AM: Received call back from [place] who states due to pt's medical complaints of pain and not meeting ASAM requirements, it was a solid No on pt being admitted to their facility  Team updated.  Signed:  Jerome Viglione, LCSW-A 08/19/2024  11:19 AM

## 2024-08-19 NOTE — Group Note (Signed)
 Date:  08/19/2024 Time:  1:40 PM  Group Topic/Focus:  Recovery Goals:   The focus of this group is to identify appropriate goals for recovery and establish a plan to achieve them. [employer]: To understand individualized and supportive care plans that promote mental health recovery, enhance coping skills, and improve overall well-being, while fostering a safe and inclusive environment for all participants.   Participation Level:  Active  Participation Quality:  Appropriate  Affect:  Appropriate  Cognitive:  Appropriate  Insight: Appropriate  Engagement in Group:  Engaged  Modes of Intervention:  Discussion and Education  Additional Comments:    Adelina Collard R Mehreen Azizi 08/19/2024, 1:40 PM

## 2024-08-19 NOTE — Progress Notes (Addendum)
 [facility] MD Progress Note  08/19/2024 11:45 AM Nancy Howell  MRN:  [id number] Subjective:   Nancy Howell is a 48 year old female with a past psychiatric history of schizoaffective disorder bipolar type, GAD, and cocaine use disorder with recent cocaine use who presented to ED on 08/12/24 for suicidal ideation. She is currently admitted on a voluntary basis to the behavioral health hospital.    Case was discussed in the multidisciplinary team. MAR was reviewed and patient was compliant with medications. PRNs needed include ibuprofen, trazodone, and nicotine.  No medication adjustments were made yesterday, as the patient remains psychiatrically stable. [facility] denied patient for the second time as of 10/7   Interview today patient reports she slept good last night.  She reports her appetite is doing fine.  She reports no SI, HI, or AVH.  She states the last time she saw spirits her spirits were few days ago, stating that the medication is working well for her.  She states that her menstrual period has gotten better and she is only spotting now.  She states her shoulder pain has gotten significantly better with the lidocaine patches. The patient also requested to restart Depakote, stating, "I take Depakote twice daily for my seizure disorder and I haven't had it since I was admitted." Review of the EMR indicates Depakote was discontinued on 10/1 by the provider, with the last documented prescription being from November 2017 (500 mg twice daily). When spoken to about possible longterm rehab facility, patient became paranoid stating I don't want to go back there. The last time I was there I OD and I wasn't efven allowed to have drugs on me. They poisoned my food. When asked if she thinks that is happening now, patient stated no, it is a lot better here now that I am on my medications.   Per Child psychotherapist: She is really worried about being on the street in [place] - said why would she even have driven me here  from [place] if I can't live with her at discharge etc. (valid). she was acting paranoid about going to a long term program talking about there being drugs in the food and her overdosing and dying if she went - no clue where that came from   Collateral, Sister Stephane, [phone] Spoke briefly with the patient's sister, Stephane who was at work at the time of the call. She was unsure of the patient's current medications or last injection date. She is available for follow-up tomorrow when she is off work and plans to call or visit to discuss rehab options. When informed that [facility] refused admission due to lack of insurance, [name] stated that Katee has agreed to pursue long-term rehab and is not welcome to return home unless she completes treatment. She ended the call shortly after.  Principal Problem: Severe stimulant use disorder (HCC) Diagnosis: Principal Problem:   Severe stimulant use disorder (HCC) Active Problems:   GAD (generalized anxiety disorder)   Schizoaffective disorder (HCC)  Total Time spent with patient: 20 minutes  Past Psychiatric History:  Previous psychiatric diagnoses: Schizoaffective disorder, Bipolar I Disorder, Substance induce mood disorder, GAD  Prior psychiatric treatment: Zyprexa, Depakote and Haldol , Cogentin Psychiatric medication compliance history: Noncompliant  Current psychiatric treatment: Invega Trinza 546 MG/1.75ML injection given on 07/28/2024 -- next dose 10/19/2024.  Current psychiatrist: None Current therapist: None  Previous hospitalizations: Recent hospitalization at [facility] where she was discharged on Invega Trinza  History of suicide attempts: Denies History of self harm:  Denies   Past Medical History:  Past Medical History:  Diagnosis Date   Bipolar 1 disorder (HCC)    Schizo affective schizophrenia (HCC)    History reviewed. No pertinent surgical history. Family History: History reviewed. No pertinent family history. Family Psychiatric   History: None reported Social History:  Social History   Substance and Sexual Activity  Alcohol Use Yes   Comment: socially     Social History   Substance and Sexual Activity  Drug Use No    Social History   Socioeconomic History   Marital status: Single    Spouse name: Not on file   Number of children: Not on file   Years of education: Not on file   Highest education level: Not on file  Occupational History   Not on file  Tobacco Use   Smoking status: Every Day    Current packs/day: 0.50    Types: Cigarettes   Smokeless tobacco: Never  Substance and Sexual Activity   Alcohol use: Yes    Comment: socially   Drug use: No   Sexual activity: Not on file  Other Topics Concern   Not on file  Social History Narrative   Not on file   Social Drivers of Health   Financial Resource Strain: Not on file  Food Insecurity: No Food Insecurity (08/12/2024)   Hunger Vital Sign    Worried About Running Out of Food in the Last Year: Never true    Ran Out of Food in the Last Year: Never true  Transportation Needs: No Transportation Needs (08/12/2024)   PRAPARE - Administrator, Civil Service (Medical): No    Lack of Transportation (Non-Medical): No  Physical Activity: Not on file  Stress: Not on file  Social Connections: Not on file   Additional Social History:                         Sleep: Good Estimated Sleeping Duration (Last 24 Hours): 10.00-11.75 hours  Appetite:  Good  Current Medications: Current Facility-Administered Medications  Medication Dose Route Frequency Provider Last Rate Last Admin   acetaminophen (TYLENOL) tablet 1,000 mg  1,000 mg Oral Q6H PRN McCarty, Artie, MD   1,000 mg at 08/16/24 1941   alum & mag hydroxide-simeth (MAALOX/MYLANTA) 200-200-20 MG/5ML suspension 30 mL  30 mL Oral Q4H PRN Motley-Mangrum, Jadeka A, PMHNP       benztropine (COGENTIN) tablet 2 mg  2 mg Oral BID Marry Clamp, MD   2 mg at 08/19/24 0800    haloperidol  (HALDOL ) tablet 5 mg  5 mg Oral TID PRN Motley-Mangrum, Jadeka A, PMHNP       And   diphenhydrAMINE (BENADRYL) capsule 50 mg  50 mg Oral TID PRN Motley-Mangrum, Jadeka A, PMHNP       haloperidol  lactate (HALDOL ) injection 5 mg  5 mg Intramuscular TID PRN Motley-Mangrum, Jadeka A, PMHNP       And   diphenhydrAMINE (BENADRYL) injection 50 mg  50 mg Intramuscular TID PRN Motley-Mangrum, Jadeka A, PMHNP       And   LORazepam (ATIVAN) injection 2 mg  2 mg Intramuscular TID PRN Motley-Mangrum, Jadeka A, PMHNP       haloperidol  lactate (HALDOL ) injection 10 mg  10 mg Intramuscular TID PRN Motley-Mangrum, Jadeka A, PMHNP       And   diphenhydrAMINE (BENADRYL) injection 50 mg  50 mg Intramuscular TID PRN Motley-Mangrum, Jadeka A, PMHNP  And   LORazepam (ATIVAN) injection 2 mg  2 mg Intramuscular TID PRN Motley-Mangrum, Jadeka A, PMHNP       feeding supplement (ENSURE PLUS HIGH PROTEIN) liquid 237 mL  237 mL Oral BID BM Marry Clamp, MD   237 mL at 08/19/24 1121   fluticasone (FLONASE) 50 MCG/ACT nasal spray 1 spray  1 spray Each Nare Q12H PRN Towana Leita SAILOR, MD   1 spray at 08/16/24 1331   fluticasone (FLONASE) 50 MCG/ACT nasal spray 2 spray  2 spray Each Nare Daily McCarty, Artie, MD   2 spray at 08/19/24 0801   hydrOXYzine (ATARAX) tablet 25 mg  25 mg Oral TID PRN Motley-Mangrum, Jadeka A, PMHNP   25 mg at 08/18/24 2058   ibuprofen (ADVIL) tablet 600 mg  600 mg Oral Q6H PRN McCarty, Artie, MD   600 mg at 08/19/24 0638   lidocaine (LIDODERM) 5 % 1 patch  1 patch Transdermal Daily Mannie Ashley SAILOR, MD   1 patch at 08/19/24 0801   magnesium hydroxide (MILK OF MAGNESIA) suspension 30 mL  30 mL Oral Daily PRN Motley-Mangrum, Jadeka A, PMHNP   30 mL at 08/13/24 2039   nicotine polacrilex (NICORETTE) gum 2 mg  2 mg Oral PRN Motley-Mangrum, Jadeka A, PMHNP   2 mg at 08/19/24 9361   sodium chloride (OCEAN) 0.65 % nasal spray 1 spray  1 spray Each Nare PRN Towana Leita SAILOR, MD        traZODone (DESYREL) tablet 50 mg  50 mg Oral QHS PRN Bobbitt, Shalon E, NP   50 mg at 08/18/24 2058    Lab Results: No results found for this or any previous visit (from the past 48 hours).  Blood Alcohol level:  Lab Results  Component Value Date   [facility] <15 08/12/2024   ETH <11 08/02/2011    Metabolic Disorder Labs: Lab Results  Component Value Date   HGBA1C 4.9 08/16/2024   MPG 93.93 08/16/2024   No results found for: PROLACTIN Lab Results  Component Value Date   CHOL 172 08/16/2024   TRIG 108 08/16/2024   HDL 54 08/16/2024   CHOLHDL 3.2 08/16/2024   VLDL 22 08/16/2024   LDLCALC 96 08/16/2024    Physical Findings: AIMS:  ,  ,  ,  ,  ,  ,   CIWA:    COWS:     Musculoskeletal: Strength & Muscle Tone: within normal limits Gait & Station: normal Patient leans: N/A  Psychiatric Specialty Exam:  Presentation  General Appearance: Casual  Eye Contact:Good  Speech:Clear and Coherent  Speech Volume:Decreased  Handedness:No data recorded  Mood and Affect  Mood:Euthymic  Affect:Congruent   Thought Process  Thought Processes:Coherent  Descriptions of Associations:No data recorded Orientation:Full (Time, Place and Person)  Thought Content:Logical  History of Schizophrenia/Schizoaffective disorder:No data recorded Duration of Psychotic Symptoms:No data recorded Hallucinations:Hallucinations: None  Ideas of Reference:None  Suicidal Thoughts:Suicidal Thoughts: No  Homicidal Thoughts:Homicidal Thoughts: No   Sensorium  Memory:Immediate Good; Recent Good  Judgment:Poor  Insight:Poor   Executive Functions  Concentration:No data recorded Attention Span:No data recorded Recall:No data recorded Fund of Knowledge:No data recorded Language:Good   Psychomotor Activity  Psychomotor Activity:Psychomotor Activity: Normal   Assets  Assets:Communication Skills; Resilience   Sleep  Sleep:Sleep: Good    Physical Exam: Physical  Exam Constitutional:      General: She is not in acute distress.    Appearance: Normal appearance. She is normal weight. She is not ill-appearing or toxic-appearing.  HENT:  Head: Normocephalic and atraumatic.  Pulmonary:     Effort: Pulmonary effort is normal.  Neurological:     General: No focal deficit present.    Review of Systems  Gastrointestinal:  Negative for abdominal pain, constipation, diarrhea, nausea and vomiting.   Blood pressure (!) 122/90, pulse 95, temperature 98.1 F (36.7 C), temperature source Oral, resp. rate 16, height 5' 1 (1.549 m), weight 59 kg, SpO2 100%. Body mass index is 24.58 kg/m.   Assessment/Plan: Samai Corea is a 48 year old female with a past psychiatric history of schizoaffective disorder bipolar type, GAD, and cocaine use disorder with recent cocaine use who presented to ED on 09/30 for suicidal ideation.  She is currently admitted on a voluntary basis to the behavioral health hospital. Despite stabilization, disposition planning remains complicated by her lack of insurance and limited family support. [facility] has declined admission due to her uninsured status, and her sister has stated she cannot return home unless she completes long-term rehab.  Given that our facility does not coordinate long-term rehabilitation placements for patients, patient will need to initiate contact with rehab programs independently. If her sister is unwilling to house her and no rehab placement is secured, the plan will be to discharge the patient with a bus pass and referral to the [facility] shelter, along with continued outpatient mental health follow-up and substance use resources.  Schizoaffective disorder, cocaine use disorder -- Continue Invega Trinza 546 mg once every 12 weeks for schizoaffective; next dose on 10/20/2024 included in discharge orders - Continue Cogentin 2 mg twice daily, continue to monitor for EPS -- Continue lidocaine patch for R shoulder pain -- [facility]  refused patient 10/7, will follow up with patient for long term options, will need to follow up with sister on dispo planning - Trazodone 50 mg at bedtime as needed for insomnia - Atarax 25 mg TID as needed for anxiety - Agitation Protocol: haldol , benadryl, ativan  Nicotine withdrawal - Patient in need of nicotine replacement; nicotine polacrilex (gum) ordered. Smoking cessation encouraged  Safety and Monitoring: - Voluntary admission to inpatient psychiatric unit for safety, stabilization and treatment - Daily contact with patient to assess and evaluate symptoms and progress in treatment - Patient's case to be discussed in multi-disciplinary team meeting - Observation Level : q15 minute checks - Vital signs:  q12 hours - Precautions: suicide, elopement, and assault  Other as needed medications  Tylenol 650 mg every 6 hours as needed for pain Mylanta 30 mL every 4 hours as needed for indigestion Milk of magnesia 30 mL daily as needed for constipation   The risks/benefits/side-effects/alternatives to the above medication were discussed in detail with the patient and time was given for questions. The patient consents to medication trial. FDA black box warnings, if present, were discussed.  The patient is agreeable with the medication plan, as above. We will monitor the patient's response to pharmacologic treatment, and adjust medications as necessary.  4.   Discharge Planning:  - Social work and case management to assist with discharge planning and identification of hospital follow-up needs prior to discharge - Estimated LOS: 5-7 days  - Discharge Concerns: Need to establish a safety plan; Medication compliance and effectiveness - Discharge Goals: Return home with outpatient referrals for mental health follow-up including medication management/psychotherapy  I certify that inpatient services furnished can reasonably be expected to improve the patient's condition.     Alan Maiden, MD 08/19/2024, 11:45 AM

## 2024-08-19 NOTE — Group Note (Signed)
 Recreation Therapy Group Note   Group Topic:Animal Assisted Therapy   Group Date: 08/19/2024 Start Time: 9050 End Time: 1030 Facilitators: Orlean Holtrop-McCall, LRT,CTRS Location: [address] Dayroom   Animal-Assisted Activity (AAA) Program Checklist/Progress Notes Patient Eligibility Criteria Checklist & Daily Group note for Rec Tx Intervention  AAA/T Program Assumption of Risk Form signed by Patient/ or Parent Legal Guardian Yes  Patient understands his/her participation is voluntary Yes  Behavioral Response:    Education: Charity fundraiser, Appropriate Animal Interaction   Education Outcome: Acknowledges education.    Affect/Mood: N/A   Participation Level: Did not attend    Clinical Observations/Individualized Feedback:      Plan: Continue to engage patient in RT group sessions 2-3x/week.   Nancy Howell, LRT,CTRS 08/19/2024 12:35 PM

## 2024-08-19 NOTE — Group Note (Signed)
 Date:  08/19/2024 Time:  9:56 AM  Group Topic/Focus:  Goals Group:   The focus of this group is to help patients establish daily goals to achieve during treatment and discuss how the patient can incorporate goal setting into their daily lives to aide in recovery.    Participation Level:  Did Not Attend   Nancy Howell 08/19/2024, 9:56 AM

## 2024-08-19 NOTE — Group Note (Signed)
 Date:  08/19/2024 Time:  5:27 PM  Group Topic/Focus:  Dimensions of Wellness:   The focus of this group is to introduce the topic of wellness and discuss the role each dimension of wellness plays in total health.    Participation Level:  Minimal  Participation Quality:  Intrusive and Inattentive  Affect:  Flat  Cognitive:  Disorganized  Insight: Lacking  Engagement in Group:  Distracting  Modes of Intervention:  Discussion  Nancy Howell  Nancy Howell 08/19/2024, 5:27 PM

## 2024-08-19 NOTE — Progress Notes (Signed)
(  Sleep Hours) - 14.5 (Any PRNs that were needed, meds refused, or side effects to meds)- PRN vistaril 25 mg, trazodone 50 mg, and ibuprofen 600 mg given at pt request, no meds refused.  (Any disturbances and when (visitation, over night)- None  (Concerns raised by the patient)- None  (SI/HI/AVH)- Denies SI/HI/AVH

## 2024-08-20 DIAGNOSIS — F259 Schizoaffective disorder, unspecified: Secondary | ICD-10-CM | POA: Diagnosis not present

## 2024-08-20 DIAGNOSIS — F411 Generalized anxiety disorder: Secondary | ICD-10-CM | POA: Diagnosis not present

## 2024-08-20 DIAGNOSIS — F1520 Other stimulant dependence, uncomplicated: Secondary | ICD-10-CM | POA: Diagnosis not present

## 2024-08-20 MED ORDER — CHLORPROMAZINE HCL 25 MG PO TABS
25.0000 mg | ORAL_TABLET | Freq: Three times a day (TID) | ORAL | Status: DC
  Administered 2024-08-20 – 2024-08-21 (×3): 25 mg via ORAL
  Filled 2024-08-20 (×4): qty 1

## 2024-08-20 MED ORDER — CHLORPROMAZINE HCL 25 MG PO TABS
25.0000 mg | ORAL_TABLET | Freq: Once | ORAL | Status: AC
  Administered 2024-08-20: 25 mg via ORAL

## 2024-08-20 NOTE — Progress Notes (Addendum)
 Nancy Howell   Type of Note: Phone Call/Dispo planning  Spoke with pt this morning who continues to report not being interested in a long term substance use program at discharge. Pt was also given list of substance use treatment programs again as pt had given first copy to sister.   Spoke with pt's sister, with consent from patient Nancy Howell [phone]).  Nancy Howell reports patient is still unable to discharge to her home without going into SA treatment. This Clinical research associate explained that due to Nancy Howell not being interested in a long term program at this time, as a treatment team, there is not much that can be done unless patient agrees to go. Explained that patient was denied from [facility].   Discussed again the referral that was sent to the [facility] for assistance applying for disability as this was a concern noted by [facility].  Nancy Howell inquired about getting pt's birth certificate from [place] , Medicaid transferred to [facility], and a state ID for pt as well while patient was in the hospital. Information provided to sister about how these can be done online, Stephane is aware these are things that cannot be done while pt is in the hospital by the treatment team/social work.   Nancy Howell is requesting a letter from MD stating Nancy Howell is in the hospital to assist with items listed above. MD has been notified of the request.   Will continue to assist as needed.  Signed:  Elsie Sakuma, LCSW-A 08/20/2024  11:02 AM

## 2024-08-20 NOTE — Plan of Care (Signed)
  Problem: Education: Goal: Emotional status will improve Outcome: Progressing   Problem: Education: Goal: Mental status will improve Outcome: Not Progressing

## 2024-08-20 NOTE — Group Note (Signed)
 Date:  08/20/2024 Time:  8:25 PM  Group Topic/Focus:  Wrap-Up Group:   The focus of this group is to help patients review their daily goal of treatment and discuss progress on daily workbooks.    Participation Level:  Did Not Attend  Participation Quality:  N/A  Affect:  N/A  Cognitive:  N/A  Insight: None  Engagement in Group:  N/A  Modes of Intervention:  N/A  Additional Comments:  Patient was encouraged but did not attend  Eward Mace 08/20/2024, 8:25 PM

## 2024-08-20 NOTE — Progress Notes (Signed)
 [facility] MD Progress Note  08/20/2024 1:10 PM Nancy Howell  MRN:  [id number] Subjective:   Nancy Howell is a 48 year old female with a past psychiatric history of schizoaffective disorder bipolar type, GAD, and cocaine use disorder with recent cocaine use who presented to ED on 08/12/24 for suicidal ideation. She is currently admitted on a voluntary basis to the behavioral health hospital.      Case was discussed in the multidisciplinary team. MAR was reviewed and patient was compliant with medications, refused to take Haldol .    Psychiatric Team made the following recommendations yesterday: - Haldol  5 mg TID for psychosis  On interview today, the patient reports she slept poorly last night due to "racing thoughts." She expressed fear that her sister will try to force her back into a long-term rehab facility, stating, "She's been out for my head before. I don't want to get her in trouble, but she has tried to kill me many times." The patient then shifted to discussing her belief that she is "a version of Nicki Minaj," explaining that her sister became jealous after hearing her  verse in a [employer] (patient rapped hese bums is laggin' See me in that new thing, bums is gaggin') and that at "the last rehab facility my sister put chemotherapy in my shampoo, which is why my hair fell out." She further expressed concern that the hospital is "putting heroin in my food" and that she might "overdose like Prince."  The patient refused Haldol  yesterday, stating she is "allergic" to it, but agreed to take medications for anxiety and paranoia. She denied multiple medication options, claiming allergies or potential interactions with Invega. When offered Thorazine, she initially expressed concern about overdose risk but agreed to take Thorazine 25 mg TID after reassurance. She currently denies SI, HI, and AVH.   Collateral, in person, [name] (Sister):  Jakera's sister requested a letter stating that Jorgina is hospitalized at  [facility] for submission to the [facility] in order to obtain her driver's license and activate her Medicaid. I informed her that we are unable to disclose that Elta is in a behavioral health hospital and instead provided a letter stating that the patient is currently hospitalized and has not yet been discharged from [facility].  She was educated on the process of establishing Power of Attorney [facility]) and informed that this must be appointed through the court system, not by hospital staff. She was also informed that Edris has repeatedly expressed that she does not wish to be discharged to a long-term rehabilitation facility.  Public Health Notification: Adelita from the [facility] Department of Health contacted the unit nurse regarding the patient's positive RPR result for syphilis. She inquired whether the patient was asymptomatic, the rationale for testing for syphilis and HIV, and requested a return call with this information. It was explained to [name] that the patient's low RPR titer (<=1:2 or 1:4) may indicate a past, treated infection, very early infection, or a false-positive result. The only way to confirm an active infection would be through a treponemal-specific test (e.g., FTA-ABS or TP-PA). Based on the low titer and clinical history, this finding is most consistent with a previously treated infection. She was also informed that the HIV test result was negative.    Principal Problem: Severe stimulant use disorder (HCC) Diagnosis: Principal Problem:   Severe stimulant use disorder (HCC) Active Problems:   GAD (generalized anxiety disorder)   Schizoaffective disorder (HCC)  Total Time spent with patient: 20 minutes  Past Psychiatric History:  Previous psychiatric diagnoses: Schizoaffective disorder, Bipolar I Disorder, Substance induce mood disorder, GAD  Prior psychiatric treatment: Zyprexa, Depakote and Haldol , Cogentin Psychiatric medication compliance history: Noncompliant  Current  psychiatric treatment: Invega Trinza 546 MG/1.75ML injection given on 07/28/2024 -- next dose 10/19/2024.  Current psychiatrist: None Current therapist: None  Previous hospitalizations: Recent hospitalization at [facility] where she was discharged on [place]  History of suicide attempts: Denies History of self harm: Denies   Past Medical History:  Past Medical History:  Diagnosis Date   Bipolar 1 disorder (HCC)    Schizo affective schizophrenia (HCC)    History reviewed. No pertinent surgical history. Family History: History reviewed. No pertinent family history. Family Psychiatric  History: None reported Social History:  Social History   Substance and Sexual Activity  Alcohol Use Yes   Comment: socially     Social History   Substance and Sexual Activity  Drug Use No    Social History   Socioeconomic History   Marital status: Single    Spouse name: Not on file   Number of children: Not on file   Years of education: Not on file   Highest education level: Not on file  Occupational History   Not on file  Tobacco Use   Smoking status: Every Day    Current packs/day: 0.50    Types: Cigarettes   Smokeless tobacco: Never  Substance and Sexual Activity   Alcohol use: Yes    Comment: socially   Drug use: No   Sexual activity: Not on file  Other Topics Concern   Not on file  Social History Narrative   Not on file   Social Drivers of Health   Financial Resource Strain: Not on file  Food Insecurity: No Food Insecurity (08/12/2024)   Hunger Vital Sign    Worried About Running Out of Food in the Last Year: Never true    Ran Out of Food in the Last Year: Never true  Transportation Needs: No Transportation Needs (08/12/2024)   PRAPARE - Administrator, Civil Service (Medical): No    Lack of Transportation (Non-Medical): No  Physical Activity: Not on file  Stress: Not on file  Social Connections: Not on file   Additional Social History:                          Sleep: Good Estimated Sleeping Duration (Last 24 Hours): 4.75-5.75 hours  Appetite:  Good  Current Medications: Current Facility-Administered Medications  Medication Dose Route Frequency Provider Last Rate Last Admin   acetaminophen (TYLENOL) tablet 1,000 mg  1,000 mg Oral Q6H PRN McCarty, Artie, MD   1,000 mg at 08/20/24 0354   alum & mag hydroxide-simeth (MAALOX/MYLANTA) 200-200-20 MG/5ML suspension 30 mL  30 mL Oral Q4H PRN Motley-Mangrum, Jadeka A, PMHNP       benztropine (COGENTIN) tablet 2 mg  2 mg Oral BID Marry Clamp, MD   2 mg at 08/20/24 9161   chlorproMAZINE (THORAZINE) tablet 25 mg  25 mg Oral TID Elodie Palma, MD   25 mg at 08/20/24 1240   haloperidol  (HALDOL ) tablet 5 mg  5 mg Oral TID PRN Motley-Mangrum, Jadeka A, PMHNP   5 mg at 08/19/24 1546   And   diphenhydrAMINE (BENADRYL) capsule 50 mg  50 mg Oral TID PRN Motley-Mangrum, Jadeka A, PMHNP   50 mg at 08/19/24 1546   haloperidol  lactate (HALDOL ) injection 5 mg  5 mg Intramuscular TID PRN Motley-Mangrum, Jadeka A, PMHNP       And   diphenhydrAMINE (BENADRYL) injection 50 mg  50 mg Intramuscular TID PRN Motley-Mangrum, Jadeka A, PMHNP       And   LORazepam (ATIVAN) injection 2 mg  2 mg Intramuscular TID PRN Motley-Mangrum, Jadeka A, PMHNP       haloperidol  lactate (HALDOL ) injection 10 mg  10 mg Intramuscular TID PRN Motley-Mangrum, Jadeka A, PMHNP       And   diphenhydrAMINE (BENADRYL) injection 50 mg  50 mg Intramuscular TID PRN Motley-Mangrum, Jadeka A, PMHNP       And   LORazepam (ATIVAN) injection 2 mg  2 mg Intramuscular TID PRN Motley-Mangrum, Jadeka A, PMHNP       feeding supplement (ENSURE PLUS HIGH PROTEIN) liquid 237 mL  237 mL Oral BID BM Marry Clamp, MD   237 mL at 08/20/24 1030   fluticasone (FLONASE) 50 MCG/ACT nasal spray 1 spray  1 spray Each Nare Q12H PRN Towana Leita SAILOR, MD   1 spray at 08/16/24 1331   fluticasone (FLONASE) 50 MCG/ACT nasal spray 2 spray  2 spray  Each Nare Daily McCarty, Artie, MD   2 spray at 08/20/24 0838   hydrOXYzine (ATARAX) tablet 25 mg  25 mg Oral TID PRN Motley-Mangrum, Jadeka A, PMHNP   25 mg at 08/19/24 2122   ibuprofen (ADVIL) tablet 600 mg  600 mg Oral Q6H PRN McCarty, Artie, MD   600 mg at 08/20/24 0850   lidocaine (LIDODERM) 5 % 1 patch  1 patch Transdermal Daily Mannie Ashley SAILOR, MD   1 patch at 08/20/24 0839   magnesium hydroxide (MILK OF MAGNESIA) suspension 30 mL  30 mL Oral Daily PRN Motley-Mangrum, Jadeka A, PMHNP   30 mL at 08/13/24 2039   nicotine polacrilex (NICORETTE) gum 2 mg  2 mg Oral PRN Motley-Mangrum, Jadeka A, PMHNP   2 mg at 08/20/24 1240   sodium chloride (OCEAN) 0.65 % nasal spray 1 spray  1 spray Each Nare PRN Towana Leita SAILOR, MD       traZODone (DESYREL) tablet 50 mg  50 mg Oral QHS PRN Bobbitt, Shalon E, NP   50 mg at 08/19/24 2122    Lab Results: No results found for this or any previous visit (from the past 48 hours).  Blood Alcohol level:  Lab Results  Component Value Date   [facility] <15 08/12/2024   ETH <11 08/02/2011    Metabolic Disorder Labs: Lab Results  Component Value Date   HGBA1C 4.9 08/16/2024   MPG 93.93 08/16/2024   No results found for: PROLACTIN Lab Results  Component Value Date   CHOL 172 08/16/2024   TRIG 108 08/16/2024   HDL 54 08/16/2024   CHOLHDL 3.2 08/16/2024   VLDL 22 08/16/2024   LDLCALC 96 08/16/2024    Physical Findings: AIMS:  ,  ,  ,  ,  ,  ,   CIWA:    COWS:     Musculoskeletal: Strength & Muscle Tone: within normal limits Gait & Station: normal Patient leans: N/A  Psychiatric Specialty Exam:  Presentation  General Appearance: Casual  Eye Contact:Good  Speech:Clear and Coherent  Speech Volume:Decreased  Handedness:No data recorded  Mood and Affect  Mood:Euthymic  Affect:Congruent   Thought Process  Thought Processes:Coherent  Descriptions of Associations:No data recorded Orientation:Full (Time, Place and Person)  Thought  Content:Logical  History of Schizophrenia/Schizoaffective disorder:No data recorded Duration of Psychotic Symptoms:No data recorded  Hallucinations:Hallucinations: None  Ideas of Reference:None  Suicidal Thoughts:Suicidal Thoughts: No  Homicidal Thoughts:Homicidal Thoughts: No   Sensorium  Memory:Immediate Good; Recent Good  Judgment:Poor  Insight:Poor   Executive Functions  Concentration:No data recorded Attention Span:No data recorded Recall:No data recorded Fund of Knowledge:No data recorded Language:Good   Psychomotor Activity  Psychomotor Activity:Psychomotor Activity: Normal   Assets  Assets:Communication Skills; Resilience   Sleep  Sleep:Sleep: Good    Physical Exam: Physical Exam Constitutional:      General: She is not in acute distress.    Appearance: Normal appearance. She is normal weight. She is not ill-appearing or toxic-appearing.  HENT:     Head: Normocephalic and atraumatic.  Pulmonary:     Effort: Pulmonary effort is normal.  Neurological:     General: No focal deficit present.    Review of Systems  Gastrointestinal:  Negative for abdominal pain, constipation, diarrhea, nausea and vomiting.   Blood pressure (!) 118/92, pulse 98, temperature 98.1 F (36.7 C), temperature source Oral, resp. rate 16, height 5' 1 (1.549 m), weight 59 kg, SpO2 100%. Body mass index is 24.58 kg/m.   Assessment/Plan: She is well-versed in psychiatric medications and demonstrates partial compliance, often expressing strong opinions and concerns about side effects or perceived allergies. The delusional content involving Nicki Minaj, her sister, and being poisoned were present on admission and may represent fixed delusions; however, given her fluctuating thought organization and recent medication adjustments, there is potential for improvement with consistent treatment and sobriety.  She remains paranoid and demonstrates limited insight into her condition,  though she is able to engage in conversation when approached calmly. Her mood remains labile, and thought content continues to reflect persecutory and somatic delusions. The patient's sister was contacted and expressed concern for Debralee's ongoing instability, advocating for placement in a long-term rehabilitation facility following discharge. The patient, however, adamantly refuses this plan and insists on returning home.  Given her chronic psychotic symptoms, history of substance use, and poor insight, she remains appropriate for continued inpatient stabilization, medication management, and coordination with family regarding a safe discharge plan.  Schizoaffective disorder, cocaine use disorder -- Continue Invega Trinza 546 mg once every 12 weeks for schizoaffective; next dose on 10/20/2024 included in discharge orders -Discontinue Haldol  5 mg 3 times daily for psychosis - Start Thorazine 25 mg tablets 3 times daily for psychosis, augment Invega - Continue Cogentin 2 mg twice daily, continue to monitor for EPS -- Continue lidocaine patch for R shoulder pain -- [facility] refused patient 10/7, will follow up with patient for long term options, will need to follow up with sister on dispo planning - Trazodone 50 mg at bedtime as needed for insomnia - Atarax 25 mg TID as needed for anxiety - Agitation Protocol: haldol , benadryl, ativan  Nicotine withdrawal - Patient in need of nicotine replacement; nicotine polacrilex (gum) ordered. Smoking cessation encouraged  Safety and Monitoring: - Voluntary admission to inpatient psychiatric unit for safety, stabilization and treatment - Daily contact with patient to assess and evaluate symptoms and progress in treatment - Patient's case to be discussed in multi-disciplinary team meeting - Observation Level : q15 minute checks - Vital signs:  q12 hours - Precautions: suicide, elopement, and assault  Other as needed medications  Tylenol 650 mg every 6 hours as  needed for pain Mylanta 30 mL every 4 hours as needed for indigestion Milk of magnesia 30 mL daily as needed for constipation   The risks/benefits/side-effects/alternatives to the above medication were discussed in  detail with the patient and time was given for questions. The patient consents to medication trial. FDA black box warnings, if present, were discussed.  The patient is agreeable with the medication plan, as above. We will monitor the patient's response to pharmacologic treatment, and adjust medications as necessary.  4.   Discharge Planning:  - Social work and case management to assist with discharge planning and identification of hospital follow-up needs prior to discharge - Estimated LOS: 5-7 days  - Discharge Concerns: Need to establish a safety plan; Medication compliance and effectiveness - Discharge Goals: Return home with outpatient referrals for mental health follow-up including medication management/psychotherapy  I certify that inpatient services furnished can reasonably be expected to improve the patient's condition.     Alan Maiden, MD 08/20/2024, 1:10 PM

## 2024-08-20 NOTE — Progress Notes (Signed)
 Spiritual care group facilitated by Chaplain Rockie Sofia, [facility]  Group focused on topic of strength. Group members reflected on what thoughts and feelings emerge when they hear this topic. They then engaged in facilitated dialog around how strength is present in their lives. This dialog focused on representing what strength had been to them in their lives (images and patterns given) and what they saw as helpful in their life now (what they needed / wanted).  Activity drew on narrative framework.  Patient Progress: Nancy Howell attended group, but did not participate.

## 2024-08-20 NOTE — Group Note (Signed)
 Date:  08/20/2024 Time:  9:40 AM  Group Topic/Focus:  Emotional Education:   The focus of this group is to discuss what feelings/emotions are, and how they are experienced. Goals Group:   The focus of this group is to help patients establish daily goals to achieve during treatment and discuss how the patient can incorporate goal setting into their daily lives to aide in recovery. Orientation:   The focus of this group is to educate the patient on the purpose and policies of crisis stabilization and provide a format to answer questions about their admission.  The group details unit policies and expectations of patients while admitted.    Participation Level:  Did Not Attend   Nancy Howell 08/20/2024, 9:40 AM

## 2024-08-20 NOTE — Group Note (Signed)
 Date:  08/20/2024 Time:  5:11 PM  Group Topic/Focus: Emotional Regulation - Cooking Up Calm Emotional Education:   The focus of this group is to discuss what feelings/emotions are, and how they are experienced. Managing Feelings:   The focus of this group is to identify what feelings patients have difficulty handling and develop a plan to handle them in a healthier way upon discharge. Group Type: Psychoeducational / Skills-Based Group Objective:  Support participants in identifying, understanding, and managing emotions using a structured, relatable approach. Group Summary: Icebreaker: Mood Menu Discussed emotional regulation as a recipe: Name it, Tame it, Frame it Explored emotional triggers and body cues Played Emotion Charades Completed Emotional Regulation Recipe worksheet Shared coping strategies and prep steps Closed with group reflection Skills Practiced: Emotional awareness Coping strategy selection Peer engagement     Additional Comments:  patient did not attend   Aliayah Tyer N Chrystian Cupples 08/20/2024, 5:11 PM

## 2024-08-20 NOTE — Group Note (Signed)
 Recreation Therapy Group Note   Group Topic:Leisure Education  Group Date: 08/20/2024 Start Time: 0931 End Time: 1015 Facilitators: Aaryanna Hyden-McCall, LRT,CTRS Location: [address] Day[address]   Group Topic: Leisure Education  Goal Area(s) Addresses:  Patient will identify positive leisure activities for use post discharge. Patient will identify at least one positive benefit of the program they create. Patient will work effectively work with peer by sharing ideas and contributing to Music therapist.  Behavioral Response: Active   Intervention: Innovation, Group Presentation   Activity: In pairs or individually, patients were asked to create a program with their teammate. Team's were tasked with designing a program, including a Name, Demographic geared towards, Hours of operation, Location and Benefits. Patients would then present ideas to group.   Education:  Leisure Scientist, physiological, Special educational needs teacher, Teamwork, Discharge Planning  Education Outcome: Acknowledges education/In group clarification offered/Needs additional education.    Affect/Mood: Appropriate   Participation Level: Active   Participation Quality: Independent   Behavior: Cooperative   Speech/Thought Process: Relevant   Insight: Fair   Judgement: Fair    Modes of Intervention: Art   Patient Response to Interventions:  Receptive   Education Outcome:  In group clarification offered    Clinical Observations/Individualized Feedback: Pt came in a little late to group. Pt created a program called [name] for [facility] Pt described her program as a program for kids with cancer. The program was funded through donations. The program would provide research for new medications to help children.     Plan: Continue to engage patient in RT group sessions 2-3x/week.   Nancy Howell, LRT,CTRS  08/20/2024 1:05 PM

## 2024-08-20 NOTE — Plan of Care (Signed)
  Problem: Coping: Goal: Ability to verbalize frustrations and anger appropriately will improve Outcome: Progressing   Problem: Safety: Goal: Periods of time without injury will increase Outcome: Progressing   Problem: Education: Goal: Knowledge of [facility] Education information/materials will improve Outcome: Not Progressing   Problem: Activity: Goal: Interest or engagement in activities will improve Outcome: Not Progressing

## 2024-08-20 NOTE — Progress Notes (Signed)
   08/20/24 1100  Psych Admission Type (Psych Patients Only)  Admission Status Voluntary  Psychosocial Assessment  Patient Complaints None  Eye Contact Brief  Facial Expression Animated  Affect Preoccupied  Speech Logical/coherent  Interaction Forwards little  Motor Activity Pacing  Appearance/Hygiene Unremarkable  Behavior Characteristics Anxious  Mood Preoccupied;Suspicious  Thought Process  Coherency WDL  Content Preoccupation;Paranoia  Delusions Paranoid  Perception WDL  Hallucination None reported or observed  Judgment Poor  Confusion None  Danger to Self  Current suicidal ideation? Denies  Agreement Not to Harm Self Yes  Description of Agreement Verbal  Danger to Others  Danger to Others None reported or observed

## 2024-08-20 NOTE — Group Note (Signed)
 Date:  08/20/2024 Time:  5:44 AM  Group Topic/Focus:  Wrap-Up Group:   The focus of this group is to help patients review their daily goal of treatment and discuss progress on daily workbooks.    Additional Comments:  Pt was encouraged, but opted out of attending wrap up group this evening.  Nancy Howell 08/20/2024, 5:44 AM

## 2024-08-21 DIAGNOSIS — F1520 Other stimulant dependence, uncomplicated: Secondary | ICD-10-CM | POA: Diagnosis not present

## 2024-08-21 DIAGNOSIS — F1490 Cocaine use, unspecified, uncomplicated: Secondary | ICD-10-CM | POA: Diagnosis not present

## 2024-08-21 DIAGNOSIS — F259 Schizoaffective disorder, unspecified: Secondary | ICD-10-CM | POA: Diagnosis not present

## 2024-08-21 DIAGNOSIS — F17203 Nicotine dependence unspecified, with withdrawal: Secondary | ICD-10-CM

## 2024-08-21 MED ORDER — CHLORPROMAZINE HCL 50 MG PO TABS
50.0000 mg | ORAL_TABLET | Freq: Three times a day (TID) | ORAL | Status: DC
  Administered 2024-08-22 – 2024-08-23 (×2): 50 mg via ORAL
  Filled 2024-08-21: qty 2
  Filled 2024-08-21 (×3): qty 1

## 2024-08-21 NOTE — Progress Notes (Signed)
 [facility] MD Progress Note  08/21/2024 8:06 AM Nancy Howell  MRN:  [id number]  Subjective:   Nancy Howell is a 48 year old female with a past psychiatric history of schizoaffective disorder bipolar type, GAD, and cocaine use disorder with recent cocaine use who presented to ED on 08/12/24 for suicidal ideation. She is currently admitted on a voluntary basis to the behavioral health hospital.      Case was discussed in the multidisciplinary team. MAR was reviewed and patient was compliant with medications, refused to take Haldol .    Psychiatric Team made the following recommendations yesterday: - Haldol  5 mg TID for psychosis  The patient mood remains abnormally elevated and behavior is hyperactive.  Speech is mildly pressured.  The patient has tolerated introduction of Thorazine well with no observed or reported side effects.  She informs me that she feels hyperactive and has taken Depakote in the past to stabilize her mood and behavior.  The patient informs me that she had a tubal ligation and is unable to get pregnant.  Disposition is still questionable as the patient is not allowed to return home to live with family unless she goes to a outpatient substance abuse program or residential substance abuse program.  The patient does not want to attend 1 of these programs.  I offered a homeless shelter referral and she did not want to do this.  Denies SI, HI and AVH.  Sleep is mixed.  Appetite strong.  No EPS.  Review of systems is positive for foul vaginal odor and patient would like to have medication for possible vaginal bacterial infection.  No burning on urination or frequency.   Collateral, in person, [name] (Sister):  Blondell's sister requested a letter stating that Nancy Howell is hospitalized at [facility] for submission to the [facility] in order to obtain her driver's license and activate her Medicaid. I informed her that we are unable to disclose that Nancy Howell is in a behavioral health hospital and instead provided a letter  stating that the patient is currently hospitalized and has not yet been discharged from [facility].  She was educated on the process of establishing Power of Attorney [facility]) and informed that this must be appointed through the court system, not by hospital staff. She was also informed that Nancy Howell has repeatedly expressed that she does not wish to be discharged to a long-term rehabilitation facility.  Public Health Notification: Adelita from the [facility] Department of Health contacted the unit nurse regarding the patient's positive RPR result for syphilis. She inquired whether the patient was asymptomatic, the rationale for testing for syphilis and HIV, and requested a return call with this information. It was explained to Nancy Howell that the patient's low RPR titer (<=1:2 or 1:4) may indicate a past, treated infection, very early infection, or a false-positive result. The only way to confirm an active infection would be through a treponemal-specific test (e.g., FTA-ABS or TP-PA). Based on the low titer and clinical history, this finding is most consistent with a previously treated infection. She was also informed that the HIV test result was negative.    Principal Problem: Severe stimulant use disorder (HCC) Diagnosis: Principal Problem:   Severe stimulant use disorder (HCC) Active Problems:   GAD (generalized anxiety disorder)   Schizoaffective disorder (HCC)  Total Time spent with patient: 35 minutes  Past Psychiatric History:  Previous psychiatric diagnoses: Schizoaffective disorder, Bipolar I Disorder, Substance induce mood disorder, GAD  Prior psychiatric treatment: Zyprexa, Depakote and Haldol , Cogentin Psychiatric medication compliance  history: Noncompliant  Current psychiatric treatment: Invega Trinza 546 MG/1.75ML injection given on 07/28/2024 -- next dose 10/19/2024.  Current psychiatrist: None Current therapist: None  Previous hospitalizations: Recent hospitalization at  [facility] [place] [facility] where she was discharged on [place]  History of suicide attempts: Denies History of self harm: Denies   Past Medical History:  Past Medical History:  Diagnosis Date   Bipolar 1 disorder (HCC)    Schizo affective schizophrenia (HCC)    History reviewed. No pertinent surgical history. Family History: History reviewed. No pertinent family history. Family Psychiatric  History: None reported Social History:  Social History   Substance and Sexual Activity  Alcohol Use Yes   Comment: socially     Social History   Substance and Sexual Activity  Drug Use No    Social History   Socioeconomic History   Marital status: Single    Spouse name: Not on file   Number of children: Not on file   Years of education: Not on file   Highest education level: Not on file  Occupational History   Not on file  Tobacco Use   Smoking status: Every Day    Current packs/day: 0.50    Types: Cigarettes   Smokeless tobacco: Never  Substance and Sexual Activity   Alcohol use: Yes    Comment: socially   Drug use: No   Sexual activity: Not on file  Other Topics Concern   Not on file  Social History Narrative   Not on file   Social Drivers of Health   Financial Resource Strain: Not on file  Food Insecurity: No Food Insecurity (08/12/2024)   Hunger Vital Sign    Worried About Running Out of Food in the Last Year: Never true    Ran Out of Food in the Last Year: Never true  Transportation Needs: No Transportation Needs (08/12/2024)   PRAPARE - Administrator, Civil Service (Medical): No    Lack of Transportation (Non-Medical): No  Physical Activity: Not on file  Stress: Not on file  Social Connections: Not on file   Additional Social History:                         Sleep: Good Estimated Sleeping Duration (Last 24 Hours): 7.50-9.00 hours  Appetite:  Good  Current Medications: Current Facility-Administered Medications  Medication Dose Route  Frequency Provider Last Rate Last Admin   acetaminophen (TYLENOL) tablet 1,000 mg  1,000 mg Oral Q6H PRN McCarty, Artie, MD   1,000 mg at 08/20/24 0354   alum & mag hydroxide-simeth (MAALOX/MYLANTA) 200-200-20 MG/5ML suspension 30 mL  30 mL Oral Q4H PRN Motley-Mangrum, Jadeka A, PMHNP       benztropine (COGENTIN) tablet 2 mg  2 mg Oral BID Marry Clamp, MD   2 mg at 08/21/24 0747   chlorproMAZINE (THORAZINE) tablet 25 mg  25 mg Oral TID Elodie Palma, MD   25 mg at 08/21/24 0745   haloperidol  (HALDOL ) tablet 5 mg  5 mg Oral TID PRN Motley-Mangrum, Jadeka A, PMHNP   5 mg at 08/19/24 1546   And   diphenhydrAMINE (BENADRYL) capsule 50 mg  50 mg Oral TID PRN Motley-Mangrum, Jadeka A, PMHNP   50 mg at 08/19/24 1546   haloperidol  lactate (HALDOL ) injection 5 mg  5 mg Intramuscular TID PRN Motley-Mangrum, Jadeka A, PMHNP       And   diphenhydrAMINE (BENADRYL) injection 50 mg  50 mg Intramuscular TID PRN  Motley-Mangrum, Jadeka A, PMHNP       And   LORazepam (ATIVAN) injection 2 mg  2 mg Intramuscular TID PRN Motley-Mangrum, Jadeka A, PMHNP       haloperidol  lactate (HALDOL ) injection 10 mg  10 mg Intramuscular TID PRN Motley-Mangrum, Jadeka A, PMHNP       And   diphenhydrAMINE (BENADRYL) injection 50 mg  50 mg Intramuscular TID PRN Motley-Mangrum, Jadeka A, PMHNP       And   LORazepam (ATIVAN) injection 2 mg  2 mg Intramuscular TID PRN Motley-Mangrum, Jadeka A, PMHNP       feeding supplement (ENSURE PLUS HIGH PROTEIN) liquid 237 mL  237 mL Oral BID BM Marry Clamp, MD   237 mL at 08/20/24 1030   fluticasone (FLONASE) 50 MCG/ACT nasal spray 1 spray  1 spray Each Nare Q12H PRN Towana Leita SAILOR, MD   1 spray at 08/16/24 1331   fluticasone (FLONASE) 50 MCG/ACT nasal spray 2 spray  2 spray Each Nare Daily McCarty, Artie, MD   2 spray at 08/21/24 0747   hydrOXYzine (ATARAX) tablet 25 mg  25 mg Oral TID PRN Motley-Mangrum, Jadeka A, PMHNP   25 mg at 08/19/24 2122   ibuprofen (ADVIL) tablet 600 mg  600  mg Oral Q6H PRN McCarty, Artie, MD   600 mg at 08/21/24 0624   lidocaine (LIDODERM) 5 % 1 patch  1 patch Transdermal Daily Mannie Ashley SAILOR, MD   1 patch at 08/21/24 0746   magnesium hydroxide (MILK OF MAGNESIA) suspension 30 mL  30 mL Oral Daily PRN Motley-Mangrum, Jadeka A, PMHNP   30 mL at 08/13/24 2039   nicotine polacrilex (NICORETTE) gum 2 mg  2 mg Oral PRN Motley-Mangrum, Jadeka A, PMHNP   2 mg at 08/21/24 0724   sodium chloride (OCEAN) 0.65 % nasal spray 1 spray  1 spray Each Nare PRN Towana Leita SAILOR, MD       traZODone (DESYREL) tablet 50 mg  50 mg Oral QHS PRN Bobbitt, Shalon E, NP   50 mg at 08/19/24 2122    Lab Results: No results found for this or any previous visit (from the past 48 hours).  Blood Alcohol level:  Lab Results  Component Value Date   [facility] <15 08/12/2024   ETH <11 08/02/2011    Metabolic Disorder Labs: Lab Results  Component Value Date   HGBA1C 4.9 08/16/2024   MPG 93.93 08/16/2024   No results found for: PROLACTIN Lab Results  Component Value Date   CHOL 172 08/16/2024   TRIG 108 08/16/2024   HDL 54 08/16/2024   CHOLHDL 3.2 08/16/2024   VLDL 22 08/16/2024   LDLCALC 96 08/16/2024    Physical Findings: AIMS:  ,  ,  ,  ,  ,  ,   CIWA:    COWS:     Musculoskeletal: Strength & Muscle Tone: within normal limits Gait & Station: normal Patient leans: N/A  Psychiatric Specialty Exam:  Presentation  General Appearance: Casual (Short hair loss which she attributes to being poisoned.)  Eye Contact:Good  Speech: Clear and coherent no pressured  Speech Volume:Decreased (Childlike voice)  Handedness:No data recorded  Mood and Affect  Mood:Anxious  Affect:Labile (Cooperative at times but easily agitated when discussing her sister or perceived mistreatment. Frequently shifts between calmness and distress.)   Thought Process  Thought Processes: Derails  Descriptions of Associations:Loose  Orientation:Full (Time, Place and  Person)  Thought Content:Delusions; Paranoid Ideation (Prominent persecutory and somatic delusions (believes her  sister has tried to kill her, that chemotherapy was put in her shampoo, and that heroin is being placed in her food). Persistent fixed delusions involving Nicki Minaj present on admission)  History of Schizophrenia/Schizoaffective disorder:Yes  Duration of Psychotic Symptoms:Greater than six months  Hallucinations:Hallucinations: None  Ideas of Reference:Paranoia  Suicidal Thoughts:Suicidal Thoughts: No  Homicidal Thoughts:Homicidal Thoughts: No   Sensorium  Memory:Immediate Good  Judgment:Poor  Insight:Poor   Executive Functions  Concentration:No data recorded Attention Span:No data recorded Recall:No data recorded Fund of Knowledge:No data recorded Language:Good   Psychomotor Activity  Psychomotor Activity:Psychomotor Activity: Normal   Assets  Assets:Desire for Improvement   Sleep  Sleep:Sleep: Poor    Physical Exam: Physical Exam Constitutional:      General: She is not in acute distress.    Appearance: Normal appearance. She is normal weight. She is not ill-appearing or toxic-appearing.  HENT:     Head: Normocephalic and atraumatic.  Pulmonary:     Effort: Pulmonary effort is normal.  Neurological:     General: No focal deficit present.    Review of Systems  Gastrointestinal:  Negative for abdominal pain, constipation, diarrhea, nausea and vomiting.   Blood pressure (!) 124/91, pulse 92, temperature 98.1 F (36.7 C), temperature source Oral, resp. rate 16, height 5' 1 (1.549 m), weight 59 kg, SpO2 100%. Body mass index is 24.58 kg/m.   Assessment/Plan: She is well-versed in psychiatric medications and demonstrates partial compliance, often expressing strong opinions and concerns about side effects or perceived allergies. The delusional content involving Nicki Minaj, her sister, and being poisoned were present on admission and may  represent fixed delusions; however, given her fluctuating thought organization and recent medication adjustments, there is potential for improvement with consistent treatment and sobriety.  She remains paranoid and demonstrates limited insight into her condition, though she is able to engage in conversation when approached calmly. Her mood remains labile, and thought content continues to reflect persecutory and somatic delusions. The patient's sister was contacted and expressed concern for Layci's ongoing instability, advocating for placement in a long-term rehabilitation facility following discharge. The patient, however, adamantly refuses this plan and insists on returning home.  Given her chronic psychotic symptoms, history of substance use, and poor insight, she remains appropriate for continued inpatient stabilization, medication management, and coordination with family regarding a safe discharge plan.   10/9 -increased Thorazien to 50mg  tid. No EPS and ROS is negative. - Consider introduction of Depakote given the fact she has had a tubal ligation. - Will also discuss Flagyl, doxycycline, metronidazole or penicillin G to start tomorrow  Schizoaffective disorder, cocaine use disorder -- Continue Invega Trinza 546 mg once every 12 weeks for schizoaffective; next dose on 10/20/2024 included in discharge orders -Discontinue Haldol  5 mg 3 times daily for psychosis - Increased Thorazine 50 mg tablets 3 times daily for psychosis, augment Invega - Continue Cogentin 2 mg twice daily, continue to monitor for EPS -- Continue lidocaine patch for R shoulder pain -- [facility] refused patient 10/7, will follow up with patient for long term options, will need to follow up with sister on dispo planning - Trazodone 50 mg at bedtime as needed for insomnia - Atarax 25 mg TID as needed for anxiety - Agitation Protocol: haldol , benadryl, ativan  Nicotine withdrawal - Patient in need of nicotine replacement;  nicotine polacrilex (gum) ordered. Smoking cessation encouraged  Safety and Monitoring: - Voluntary admission to inpatient psychiatric unit for safety, stabilization and treatment - Daily contact with patient to  assess and evaluate symptoms and progress in treatment - Patient's case to be discussed in multi-disciplinary team meeting - Observation Level : q15 minute checks - Vital signs:  q12 hours - Precautions: suicide, elopement, and assault  Other as needed medications  Tylenol 650 mg every 6 hours as needed for pain Mylanta 30 mL every 4 hours as needed for indigestion Milk of magnesia 30 mL daily as needed for constipation   The risks/benefits/side-effects/alternatives to the above medication were discussed in detail with the patient and time was given for questions. The patient consents to medication trial. FDA black box warnings, if present, were discussed.  The patient is agreeable with the medication plan, as above. We will monitor the patient's response to pharmacologic treatment, and adjust medications as necessary.  4.   Discharge Planning:  - Social work and case management to assist with discharge planning and identification of hospital follow-up needs prior to discharge - Estimated LOS: 5-7 days  - Discharge Concerns: Need to establish a safety plan; Medication compliance and effectiveness - Discharge Goals: Return home with outpatient referrals for mental health follow-up including medication management/psychotherapy  I certify that inpatient services furnished can reasonably be expected to improve the patient's condition.     Lamar Handler Jama Slain, DO 08/21/2024, 8:06 AM

## 2024-08-21 NOTE — Group Note (Signed)
 Occupational Therapy Group Note  Group Topic:Coping Skills  Group Date: 08/21/2024 Start Time: 1500 End Time: 1530 Facilitators: Dot Dallas MATSU, OT   Group Description: Group encouraged increased engagement and participation through discussion and activity focused on Coping Ahead. Patients were split up into teams and selected a card from a stack of positive coping strategies. Patients were instructed to act out/charade the coping skill for other peers to guess and receive points for their team. Discussion followed with a focus on identifying additional positive coping strategies and patients shared how they were going to cope ahead over the weekend while continuing hospitalization stay.  Therapeutic Goal(s): Identify positive vs negative coping strategies. Identify coping skills to be used during hospitalization vs coping skills outside of hospital/at home Increase participation in therapeutic group environment and promote engagement in treatment   Participation Level: Engaged   Participation Quality: Independent   Behavior: Appropriate   Speech/Thought Process: Relevant   Affect/Mood: Appropriate   Insight: Fair   Judgement: Fair      Modes of Intervention: Education  Patient Response to Interventions:  Attentive   Plan: Continue to engage patient in OT groups 2 - 3x/week.  08/21/2024  [place] MATSU Dot, OT  Luetta Piazza, OT

## 2024-08-21 NOTE — Plan of Care (Signed)
 Nurse discussed anxiety, depression and coping skills with patient.

## 2024-08-21 NOTE — Group Note (Signed)
 Date:  08/21/2024 Time:  8:43 PM  Group Topic/Focus:  Wrap-Up Group:   The focus of this group is to help patients review their daily goal of treatment and discuss progress on daily workbooks.    Participation Level:  Active  Participation Quality:  Appropriate and Sharing  Affect:  Appropriate  Cognitive:  Appropriate  Insight: Appropriate  Engagement in Group:  Engaged  Modes of Intervention:  Discussion and Socialization  Additional Comments:  Patients shared one high and one low from today during group. Patient high, visit from cousin today. Patient low, still having pain in arm. Patient has informed doctor regarding the pain. Patient rated her day a 8 out of 10.  Eward Mace 08/21/2024, 8:43 PM

## 2024-08-21 NOTE — Progress Notes (Signed)
   08/20/24 2100  Psych Admission Type (Psych Patients Only)  Admission Status Voluntary  Psychosocial Assessment  Patient Complaints None  Eye Contact Brief  Facial Expression Animated  Affect Appropriate to circumstance  Speech Logical/coherent  Interaction Assertive  Motor Activity Slow  Appearance/Hygiene Unremarkable  Behavior Characteristics Appropriate to situation  Mood Preoccupied  Thought Process  Coherency WDL  Content Paranoia  Delusions Paranoid  Perception WDL  Hallucination None reported or observed  Judgment Poor  Confusion None  Danger to Self  Current suicidal ideation? Denies  Description of Suicide Plan none  Agreement Not to Harm Self Yes  Description of Agreement verbal  Danger to Others  Danger to Others None reported or observed

## 2024-08-21 NOTE — Plan of Care (Signed)
   Problem: Education: Goal: Knowledge of [facility] Education information/materials will improve Outcome: Progressing   Problem: Activity: Goal: Interest or engagement in activities will improve Outcome: Progressing   Problem: Coping: Goal: Ability to verbalize frustrations and anger appropriately will improve Outcome: Progressing   Problem: Safety: Goal: Periods of time without injury will increase Outcome: Progressing

## 2024-08-21 NOTE — Progress Notes (Signed)
 D:  Patient's self inventory sheet, patient sleeps good, no sleep medication.  Good appetite, low energy level, good concentration.  Rated depression and hopeless 5, denied anxiety.  Patient feels tired.  Denied SI.  Physical pain, worst pain #5 in past 24 hours, pain medicine helpful.  Wants to discharge to long time rehab  No discharge plans. A:  Patient refused her 12:00 noon thorazine 25 mg medication.  Emotional support and encouragement given patient. R:  Denied SI and HI, contracts for safety.  Denied A/V hallucinations.  Safety maintained with 15 minute checks.

## 2024-08-21 NOTE — Group Note (Signed)
 Recreation Therapy Group Note   Group Topic:Other  Group Date: 08/21/2024 Start Time: 1304 End Time: 1350 Facilitators: Yashira Offenberger-McCall, LRT,CTRS Location: [address] Dayroom   Activity Description/Intervention: Therapeutic Drumming. Patients with peers and staff were given the opportunity to engage in a leader facilitated [facility] Group Empowerment Drumming Circle with staff from the [employer], in partnership with [employer]. Teaching laboratory technician and trained [employer], Norleen Mon leading with LRT observing and documenting intervention and pt response. This evidenced-based practice targets 7 areas of health and wellbeing in the human experience including: stress-reduction, exercise, self-expression, camaraderie/support, nurturing, spirituality, and music-making (leisure).   Goal Area(s) Addresses:  Patient will engage in pro-social way in music group.  Patient will follow directions of drum leader on the first prompt. Patient will demonstrate no behavioral issues during group.  Patient will identify if a reduction in stress level occurs as a result of participation in therapeutic drum circle.    Education: Leisure exposure, Pharmacologist, Musical expression, Discharge Planning   Affect/Mood: Appropriate   Participation Level: Engaged   Participation Quality: Independent   Behavior: Appropriate   Speech/Thought Process: Focused   Insight: Good   Judgement: Good   Modes of Intervention: Teaching laboratory technician   Patient Response to Interventions:  Engaged   Education Outcome:  In group clarification offered    Clinical Observations/Individualized Feedback: Bhavya actively engaged in therapeutic drumming exercise and discussions. Pt was appropriate with peers, staff, and musical equipment for duration of programming. Pt affect congruent with emotions of activity.     Plan: Continue to engage patient in RT group sessions  2-3x/week.   Shereta Crothers-McCall, LRT,CTRS 08/21/2024 3:53 PM

## 2024-08-21 NOTE — Group Note (Signed)
 Date:  08/21/2024 Time:  9:26 AM  Group Topic/Focus:  Goals Group:   The focus of this group is to help patients establish daily goals to achieve during treatment and discuss how the patient can incorporate goal setting into their daily lives to aide in recovery.    Participation Level:  Did Not Attend  Participation Quality:  Did Not Attend  Affect:  Did Not Attend  Cognitive:  Did Not Attend  Insight: None  Engagement in Group:  Did Not Attend  Modes of Intervention:  Did Not Attend  Additional Comments:  Did Not Attend  Fuller Makin A Eulon Allnutt 08/21/2024, 9:26 AM

## 2024-08-21 NOTE — Progress Notes (Signed)
(  Sleep Hours) -9  (Any PRNs that were needed, meds refused, or side effects to meds)- none (Any disturbances and when (visitation, over night)-n/a  (Concerns raised by the patient)- none  (SI/HI/AVH)-denies

## 2024-08-21 NOTE — Group Note (Signed)
 LCSW Group Therapy Note   Group Date: 08/21/2024 Start Time: 1100 End Time: 1200   Participation:  did not attend  Type of Therapy:  Group Therapy  Topic:  Understanding Your Path to Change  Objective:  The goal is to help individuals understand the stages of change, identify where they currently are in the process, and provide actionable next steps to continue moving forward in their journey of change.  Goals: - Learn about the six stages of change:  Precontemplation, Contemplation, Preparation, Action, Maintenance, and Relapse - Reflect on Current Change Efforts:  Recognize which stage participants are in regarding a personal change. - Plan Next Steps for Moving Forward:  Create an action plan based on their current stage of change.  Summary:  In this session, we explored the Stages of Change as a framework to understand the process of change.  We discussed how each stage helps individuals recognize where they are in their personal journey and used the Stages of Change Worksheet for self-reflection. Participants answered questions to better understand their current stage, challenges, and progress. We also emphasized the importance of moving forward, even if setbacks (Relapse) occur, and created actionable steps to help participants continue progressing. By the end of the session, participants gained a clearer understanding of their path to change and left with a clear plan for next steps.  Therapeutic Modalities:  Elements of CBT (cognitive restructuring, problem solving)  Element of DBT (mindfulness, distress tolerance)   Nancy Howell O Hridhaan Yohn, LCSWA 08/21/2024  12:27 PM

## 2024-08-22 ENCOUNTER — Encounter (HOSPITAL_COMMUNITY): Payer: Self-pay

## 2024-08-22 DIAGNOSIS — F1520 Other stimulant dependence, uncomplicated: Secondary | ICD-10-CM | POA: Diagnosis not present

## 2024-08-22 DIAGNOSIS — F259 Schizoaffective disorder, unspecified: Secondary | ICD-10-CM | POA: Diagnosis not present

## 2024-08-22 DIAGNOSIS — F1490 Cocaine use, unspecified, uncomplicated: Secondary | ICD-10-CM | POA: Diagnosis not present

## 2024-08-22 DIAGNOSIS — F17203 Nicotine dependence unspecified, with withdrawal: Secondary | ICD-10-CM | POA: Diagnosis not present

## 2024-08-22 MED ORDER — DIVALPROEX SODIUM 500 MG PO DR TAB
500.0000 mg | DELAYED_RELEASE_TABLET | Freq: Two times a day (BID) | ORAL | Status: DC
Start: 2024-08-22 — End: 2024-09-03
  Administered 2024-08-22 – 2024-09-03 (×23): 500 mg via ORAL
  Filled 2024-08-22 (×10): qty 1
  Filled 2024-08-22: qty 14
  Filled 2024-08-22 (×14): qty 1

## 2024-08-22 NOTE — Progress Notes (Signed)
 Pt refused Thorazine this morning.

## 2024-08-22 NOTE — [facility] Counselor (Signed)
 CSW dialed phone number for [facility] [phone] for intake as patient reported being interested in substance use treatment. Patient called and was informed to call back in 15 minutes for intake assessment. CSW to assist patient with dialing again.  Bralynn Donado, LCSWA 08/22/24 3:45PM

## 2024-08-22 NOTE — Progress Notes (Signed)
(  Sleep Hours) -7.25  (Any PRNs that were needed, meds refused, or side effects to meds)- Vistaril 25 mg, Trazodone 50 mg, nicotine gum  (Any disturbances and when (visitation, over night)- pt disruptive on the unit at times   (Concerns raised by the patient)- Pt keeps pacing the unit, pt yelling, pt not redirectable. Pt delusional disorganized , pt has cognitive deficit . Pt stated earlier I want a shot, just give me a shot , I need a PRN pt has med seeking tendencies , pt appearing to try to do things so she could get a shot .   (SI/HI/AVH)-AVH

## 2024-08-22 NOTE — Progress Notes (Signed)
(  Sleep Hours) - 6.75 (Any PRNs that were needed, meds refused, or side effects to meds)- Advil, Tylenol (Any disturbances and when (visitation, over night)- n/a (Concerns raised by the patient)- none (SI/HI/AVH)- Denies

## 2024-08-22 NOTE — BH IP Treatment Plan (Signed)
 Interdisciplinary Treatment and Diagnostic Plan Update  08/22/2024 Time of Session: 12:15 PM - UPDATE Nancy Howell MRN: [id number]  Principal Diagnosis: Severe stimulant use disorder (HCC)  Secondary Diagnoses: Principal Problem:   Severe stimulant use disorder (HCC) Active Problems:   GAD (generalized anxiety disorder)   Schizoaffective disorder (HCC)   Current Medications:  Current Facility-Administered Medications  Medication Dose Route Frequency Provider Last Rate Last Admin   acetaminophen (TYLENOL) tablet 1,000 mg  1,000 mg Oral Q6H PRN McCarty, Artie, MD   1,000 mg at 08/22/24 1237   alum & mag hydroxide-simeth (MAALOX/MYLANTA) 200-200-20 MG/5ML suspension 30 mL  30 mL Oral Q4H PRN Motley-Mangrum, Jadeka A, PMHNP       benztropine (COGENTIN) tablet 2 mg  2 mg Oral BID Marry Clamp, MD   2 mg at 08/22/24 0750   chlorproMAZINE (THORAZINE) tablet 50 mg  50 mg Oral TID Chandra Charleston Sherlean Ruth, DO   50 mg at 08/22/24 1617   haloperidol  (HALDOL ) tablet 5 mg  5 mg Oral TID PRN Motley-Mangrum, Jadeka A, PMHNP   5 mg at 08/19/24 1546   And   diphenhydrAMINE (BENADRYL) capsule 50 mg  50 mg Oral TID PRN Motley-Mangrum, Jadeka A, PMHNP   50 mg at 08/19/24 1546   haloperidol  lactate (HALDOL ) injection 5 mg  5 mg Intramuscular TID PRN Motley-Mangrum, Jadeka A, PMHNP       And   diphenhydrAMINE (BENADRYL) injection 50 mg  50 mg Intramuscular TID PRN Motley-Mangrum, Jadeka A, PMHNP       And   LORazepam (ATIVAN) injection 2 mg  2 mg Intramuscular TID PRN Motley-Mangrum, Jadeka A, PMHNP       haloperidol  lactate (HALDOL ) injection 10 mg  10 mg Intramuscular TID PRN Motley-Mangrum, Jadeka A, PMHNP       And   diphenhydrAMINE (BENADRYL) injection 50 mg  50 mg Intramuscular TID PRN Motley-Mangrum, Jadeka A, PMHNP       And   LORazepam (ATIVAN) injection 2 mg  2 mg Intramuscular TID PRN Motley-Mangrum, Jadeka A, PMHNP       divalproex (DEPAKOTE) DR tablet 500 mg  500 mg Oral Q12H Elodie Palma, MD   500 mg at 08/22/24 1149   feeding supplement (ENSURE PLUS HIGH PROTEIN) liquid 237 mL  237 mL Oral BID BM Marry Clamp, MD   237 mL at 08/22/24 1618   fluticasone (FLONASE) 50 MCG/ACT nasal spray 1 spray  1 spray Each Nare Q12H PRN Towana Leita SAILOR, MD   1 spray at 08/16/24 1331   fluticasone (FLONASE) 50 MCG/ACT nasal spray 2 spray  2 spray Each Nare Daily McCarty, Artie, MD   2 spray at 08/22/24 0751   hydrOXYzine (ATARAX) tablet 25 mg  25 mg Oral TID PRN Motley-Mangrum, Jadeka A, PMHNP   25 mg at 08/19/24 2122   ibuprofen (ADVIL) tablet 600 mg  600 mg Oral Q6H PRN McCarty, Artie, MD   600 mg at 08/22/24 1857   lidocaine (LIDODERM) 5 % 1 patch  1 patch Transdermal Daily Mannie Ashley SAILOR, MD   1 patch at 08/22/24 0750   magnesium hydroxide (MILK OF MAGNESIA) suspension 30 mL  30 mL Oral Daily PRN Motley-Mangrum, Jadeka A, PMHNP   30 mL at 08/13/24 2039   nicotine polacrilex (NICORETTE) gum 2 mg  2 mg Oral PRN Motley-Mangrum, Jadeka A, PMHNP   2 mg at 08/22/24 1801   sodium chloride (OCEAN) 0.65 % nasal spray 1 spray  1 spray Each Nare PRN  Towana Leita SAILOR, MD       traZODone (DESYREL) tablet 50 mg  50 mg Oral QHS PRN Bobbitt, Shalon E, NP   50 mg at 08/21/24 2109   PTA Medications: Medications Prior to Admission  Medication Sig Dispense Refill Last Dose/Taking   benztropine (COGENTIN) 2 MG tablet Take 2 mg by mouth 2 (two) times daily.   08/13/2024 Morning   diphenhydrAMINE (BENADRYL) 25 mg capsule Take 25 mg by mouth daily.   08/12/2024   Paliperidone Palmitate (INVEGA SUSTENNA IM) Inject into the muscle. Pt received at [facility] [phone] pharmcacy refused to provide dosing and administration information   Taking    Patient Stressors: Substance abuse    Patient Strengths: Supportive family/friends   Treatment Modalities: Medication Management, Group therapy, Case management,  1 to 1 session with clinician, Psychoeducation, Recreational therapy.   Physician  Treatment Plan for Primary Diagnosis: Severe stimulant use disorder (HCC) Long Term Goal(s):     Short Term Goals:    Medication Management: Evaluate patient's response, side effects, and tolerance of medication regimen.  Therapeutic Interventions: 1 to 1 sessions, Unit Group sessions and Medication administration.  Evaluation of Outcomes: Progressing  Physician Treatment Plan for Secondary Diagnosis: Principal Problem:   Severe stimulant use disorder (HCC) Active Problems:   GAD (generalized anxiety disorder)   Schizoaffective disorder (HCC)  Long Term Goal(s):     Short Term Goals:       Medication Management: Evaluate patient's response, side effects, and tolerance of medication regimen.  Therapeutic Interventions: 1 to 1 sessions, Unit Group sessions and Medication administration.  Evaluation of Outcomes: Progressing   RN Treatment Plan for Primary Diagnosis: Severe stimulant use disorder (HCC) Long Term Goal(s): Knowledge of disease and therapeutic regimen to maintain health will improve  Short Term Goals: Ability to remain free from injury will improve, Ability to verbalize frustration and anger appropriately will improve, Ability to verbalize feelings will improve, and Ability to disclose and discuss suicidal ideas  Medication Management: RN will administer medications as ordered by provider, will assess and evaluate patient's response and provide education to patient for prescribed medication. RN will report any adverse and/or side effects to prescribing provider.  Therapeutic Interventions: 1 on 1 counseling sessions, Psychoeducation, Medication administration, Evaluate responses to treatment, Monitor vital signs and CBGs as ordered, Perform/monitor CIWA, COWS, AIMS and Fall Risk screenings as ordered, Perform wound care treatments as ordered.  Evaluation of Outcomes: Progressing   LCSW Treatment Plan for Primary Diagnosis: Severe stimulant use disorder (HCC) Long  Term Goal(s): Safe transition to appropriate next level of care at discharge, Engage patient in therapeutic group addressing interpersonal concerns.  Short Term Goals: Engage patient in aftercare planning with referrals and resources, Increase ability to appropriately verbalize feelings, Facilitate acceptance of mental health diagnosis and concerns, and Identify triggers associated with mental health/substance abuse issues  Therapeutic Interventions: Assess for all discharge needs, 1 to 1 time with Social worker, Explore available resources and support systems, Assess for adequacy in community support network, Educate family and significant other(s) on suicide prevention, Complete Psychosocial Assessment, Interpersonal group therapy.  Evaluation of Outcomes: Progressing   Progress in Treatment: Attending groups: attended some groups Participating in groups: Yes Taking medication as prescribed:  Yes Toleration medication:  Yes Family/Significant other contact made: No, will contact:  Sister, [name] [phone].  First attempt: 08/13/24 @ 350PM.  Second attempt:  second attempt: 08/14/24 @ 150PM.   Patient understands diagnosis: Yes. Discussing patient identified problems/goals with staff:  Yes. Medical problems stabilized or resolved: Yes. Denies suicidal/homicidal ideation: Yes. Issues/concerns per patient self-inventory: No.   New problem(s) identified: No, Describe:  none reported    New Short Term/Long Term Goal(s): detox, medication management for mood stabilization; elimination of SI thoughts; development of comprehensive mental wellness/sobriety plan   Patient Goals:  Figure out my SSI and possibly go to inpatient rehab   Discharge Plan or Barriers: Patient recently admitted. CSW will continue to follow and assess for appropriate referrals and possible discharge planning.      Reason for Continuation of Hospitalization: Anxiety Medication stabilization Suicidal ideation    Estimated Length of Stay: 3 - 4 days  Last 3 [place] Suicide Severity Risk Score: Flowsheet Row [facility] (Current[facility] Most recent reading at 08/12/2024  9:00 PM ED from 08/12/2024 in [facility] Emergency Department at [facility] Most recent reading at 08/12/2024  9:54 AM  C-SSRS RISK CATEGORY Low Risk Low Risk    Last PHQ 2/9 Scores:     No data to display          Scribe for Treatment Team: Amiliana Foutz O Maxten Shuler, LCSWA 08/22/2024 8:29 PM

## 2024-08-22 NOTE — Progress Notes (Signed)
   08/22/24 1000  Psych Admission Type (Psych Patients Only)  Admission Status Voluntary  Psychosocial Assessment  Patient Complaints Anxiety  Eye Contact Brief  Facial Expression Animated  Affect Appropriate to circumstance  Speech Soft  Interaction Assertive  Motor Activity Slow  Appearance/Hygiene Unremarkable  Behavior Characteristics Appropriate to situation  Mood Anxious  Thought Process  Coherency WDL  Content Paranoia  Delusions Paranoid  Perception WDL  Hallucination None reported or observed  Judgment Poor  Confusion None  Danger to Self  Current suicidal ideation? Denies  Description of Suicide Plan No Plan  Agreement Not to Harm Self Yes  Description of Agreement Verbal  Danger to Others  Danger to Others None reported or observed   Pt complaining of pain in Rt axilla and also vag discharge.  Pt given prn medication for pain as indicated.  Pt also was informed that if she cont to refuse medications a forced med order would be obtained.   Pt took thorazine at 1800 as ordered.

## 2024-08-22 NOTE — Group Note (Signed)
 Recreation Therapy Group Note   Group Topic:General Recreation  Group Date: 08/22/2024 Start Time: 0930 End Time: 1000 Facilitators: Evamaria Detore-McCall, LRT,CTRS Location: [address] Dayroom   Group Topic/Focus: General Recreation   Goal Area(s) Addresses:  Patient will use appropriate interactions with peers.   Patient will work together to come up with answers.  Behavioral Response: Distracted  Intervention: Worksheet, Music  Activity: Dentist. Patients were given a worksheet (front and back) of brain teasers. Patients could work together to figure out each individual puzzle presented. While working, LRT played music. LRT and patients went over the answers when patients completed the assignment.     Affect/Mood: Flat   Participation Level: None   Participation Quality: None   Behavior: Distracted and Disinterested   Speech/Thought Process: Distracted   Insight: None   Judgement: None   Modes of Intervention: Music and Worksheet   Patient Response to Interventions:  Disengaged   Education Outcome:  In group clarification offered    Clinical Observations/Individualized Feedback: Pt came in late to group. Pt was more focused on what she had been talking to doctors about. Pt kept mumbling about them wanting to overdose her on medication and she wasn't having it. Pt kept saying that to herself. Pt was disengaged in group.    Plan: Continue to engage patient in RT group sessions 2-3x/week.   Aras Albarran-McCall, LRT,CTRS 08/22/2024 11:48 AM

## 2024-08-22 NOTE — Group Note (Signed)
 Date:  08/22/2024 Time:  9:54 AM  Group Topic/Focus:  Goals Group:   The focus of this group is to help patients establish daily goals to achieve during treatment and discuss how the patient can incorporate goal setting into their daily lives to aide in recovery.    Participation Level:  Minimal  Participation Quality:  Appropriate  Affect:  Appropriate  Cognitive:  Oriented  Insight: Good  Engagement in Group:  Engaged  Modes of Intervention:  Orientation  Additional Comments:  she didn't really know what to say just wants her health to get back/  Nancy Howell 08/22/2024, 9:54 AM

## 2024-08-22 NOTE — Progress Notes (Signed)
 Pt keeps pacing the unit, pt yelling, pt not redirectable. Pt delusional disorganized , pt has cognitive deficit . Pt stated earlier I want a shot, just give me a shot , I need a PRN pt has med seeking tendencies , pt appearing to try to do things so she could get a shot .

## 2024-08-22 NOTE — Group Note (Signed)
 Date:  08/22/2024 Time:  8:23 PM  Group Topic/Focus:  Wrap-Up Group:   The focus of this group is to help patients review their daily goal of treatment and discuss progress on daily workbooks.    Participation Level:  Did Not Attend   Britt Theard Dacosta 08/22/2024, 8:23 PM

## 2024-08-22 NOTE — Plan of Care (Signed)
   Problem: Activity: Goal: Interest or engagement in activities will improve Outcome: Progressing Goal: Sleeping patterns will improve Outcome: Progressing   Problem: Safety: Goal: Periods of time without injury will increase Outcome: Progressing   Problem: Education: Goal: Emotional status will improve Outcome: Not Progressing Goal: Mental status will improve Outcome: Not Progressing

## 2024-08-22 NOTE — Progress Notes (Signed)
 [facility] MD Progress Note  08/22/2024 11:28 AM Makalya Nave  MRN:  [id number]  Subjective:   Nancy Howell is a 48 year old female with a past psychiatric history of schizoaffective disorder bipolar type, GAD, and cocaine use disorder with recent cocaine use who presented to ED on 08/12/24 for suicidal ideation. She is currently admitted on a voluntary basis to the behavioral health hospital.   Case was discussed in the multidisciplinary team. MAR was reviewed and patient was not compliant with medications as patient refused to take thorazine.    Psychiatric Team made the following recommendations yesterday: -Discontinue Haldol  5 mg 3 times daily for psychosis - Increased Thorazine 50 mg tablets 3 times daily for psychosis, augment Invega  The patient continues to display elevated mood, hyperactivity, and pressured speech. She remains delusional, expressing beliefs that staff are attempting to poison her and that her medication is "laced with heroin." She has refused Thorazine due to these paranoid thoughts, stating "the dosage is too high -- you're trying to overdose me."  The patient has requested Depakote, citing a past seizure disorder and that she has not taken it in months. She was counseled regarding potential adverse effects, including hepatotoxicity and pancreatitis, and verbalized understanding. The patient also confirmed contraceptive protection, reporting both condom use and a history of tubal ligation.  Disposition remains unclear, as the patient is not permitted to return home unless she agrees to an outpatient or residential substance use treatment program, which she currently refuses. The patient denies suicidal or homicidal ideation and auditory or visual hallucinations. Sleep remains inconsistent but appetite is good. No extrapyramidal symptoms observed.  Public Health Notification (89/1): [facility] Department of Health was contacted regarding a positive RPR result. Based on low titer  (<=1:2 or 1:4) and clinical history, results are most consistent with a previously treated infection. HIV test was negative.   Principal Problem: Severe stimulant use disorder (HCC) Diagnosis: Principal Problem:   Severe stimulant use disorder (HCC) Active Problems:   GAD (generalized anxiety disorder)   Schizoaffective disorder (HCC)  Total Time spent with patient: 35 minutes  Past Psychiatric History:  Previous psychiatric diagnoses: Schizoaffective disorder, Bipolar I Disorder, Substance induce mood disorder, GAD  Prior psychiatric treatment: Zyprexa, Depakote and Haldol , Cogentin Psychiatric medication compliance history: Noncompliant  Current psychiatric treatment: Invega Trinza 546 MG/1.75ML injection given on 07/28/2024 -- next dose 10/19/2024.  Current psychiatrist: None Current therapist: None  Previous hospitalizations: Recent hospitalization at [facility] [place] [facility] where she was discharged on [place]  History of suicide attempts: Denies History of self harm: Denies   Past Medical History:  Past Medical History:  Diagnosis Date   Bipolar 1 disorder (HCC)    Schizo affective schizophrenia (HCC)    History reviewed. No pertinent surgical history. Family History: History reviewed. No pertinent family history. Family Psychiatric  History: None reported Social History:  Social History   Substance and Sexual Activity  Alcohol Use Yes   Comment: socially     Social History   Substance and Sexual Activity  Drug Use No    Social History   Socioeconomic History   Marital status: Single    Spouse name: Not on file   Number of children: Not on file   Years of education: Not on file   Highest education level: Not on file  Occupational History   Not on file  Tobacco Use   Smoking status: Every Day    Current packs/day: 0.50    Types: Cigarettes  Smokeless tobacco: Never  Substance and Sexual Activity   Alcohol use: Yes    Comment: socially   Drug use: No    Sexual activity: Not on file  Other Topics Concern   Not on file  Social History Narrative   Not on file   Social Drivers of Health   Financial Resource Strain: Not on file  Food Insecurity: No Food Insecurity (08/12/2024)   Hunger Vital Sign    Worried About Running Out of Food in the Last Year: Never true    Ran Out of Food in the Last Year: Never true  Transportation Needs: No Transportation Needs (08/12/2024)   PRAPARE - Administrator, Civil Service (Medical): No    Lack of Transportation (Non-Medical): No  Physical Activity: Not on file  Stress: Not on file  Social Connections: Not on file   Additional Social History:                         Sleep: Good Estimated Sleeping Duration (Last 24 Hours): 5.25-6.50 hours  Appetite:  Good  Current Medications: Current Facility-Administered Medications  Medication Dose Route Frequency Provider Last Rate Last Admin   acetaminophen (TYLENOL) tablet 1,000 mg  1,000 mg Oral Q6H PRN McCarty, Artie, MD   1,000 mg at 08/22/24 0339   alum & mag hydroxide-simeth (MAALOX/MYLANTA) 200-200-20 MG/5ML suspension 30 mL  30 mL Oral Q4H PRN Motley-Mangrum, Jadeka A, PMHNP       benztropine (COGENTIN) tablet 2 mg  2 mg Oral BID Marry Clamp, MD   2 mg at 08/22/24 0750   chlorproMAZINE (THORAZINE) tablet 50 mg  50 mg Oral TID Chandra Charleston Sherlean Ruth, DO       haloperidol  (HALDOL ) tablet 5 mg  5 mg Oral TID PRN Motley-Mangrum, Jadeka A, PMHNP   5 mg at 08/19/24 1546   And   diphenhydrAMINE (BENADRYL) capsule 50 mg  50 mg Oral TID PRN Motley-Mangrum, Jadeka A, PMHNP   50 mg at 08/19/24 1546   haloperidol  lactate (HALDOL ) injection 5 mg  5 mg Intramuscular TID PRN Motley-Mangrum, Jadeka A, PMHNP       And   diphenhydrAMINE (BENADRYL) injection 50 mg  50 mg Intramuscular TID PRN Motley-Mangrum, Jadeka A, PMHNP       And   LORazepam (ATIVAN) injection 2 mg  2 mg Intramuscular TID PRN Motley-Mangrum, Jadeka A, PMHNP        haloperidol  lactate (HALDOL ) injection 10 mg  10 mg Intramuscular TID PRN Motley-Mangrum, Jadeka A, PMHNP       And   diphenhydrAMINE (BENADRYL) injection 50 mg  50 mg Intramuscular TID PRN Motley-Mangrum, Jadeka A, PMHNP       And   LORazepam (ATIVAN) injection 2 mg  2 mg Intramuscular TID PRN Motley-Mangrum, Jadeka A, PMHNP       divalproex (DEPAKOTE) DR tablet 500 mg  500 mg Oral Q12H Elodie Palma, MD       feeding supplement (ENSURE PLUS HIGH PROTEIN) liquid 237 mL  237 mL Oral BID BM Marry Clamp, MD   237 mL at 08/22/24 1006   fluticasone (FLONASE) 50 MCG/ACT nasal spray 1 spray  1 spray Each Nare Q12H PRN Towana Leita SAILOR, MD   1 spray at 08/16/24 1331   fluticasone (FLONASE) 50 MCG/ACT nasal spray 2 spray  2 spray Each Nare Daily McCarty, Artie, MD   2 spray at 08/22/24 0751   hydrOXYzine (ATARAX) tablet 25 mg  25 mg Oral TID PRN Motley-Mangrum, Jadeka A, PMHNP   25 mg at 08/19/24 2122   ibuprofen (ADVIL) tablet 600 mg  600 mg Oral Q6H PRN McCarty, Artie, MD   600 mg at 08/22/24 0830   lidocaine (LIDODERM) 5 % 1 patch  1 patch Transdermal Daily Mannie Ashley SAILOR, MD   1 patch at 08/22/24 0750   magnesium hydroxide (MILK OF MAGNESIA) suspension 30 mL  30 mL Oral Daily PRN Motley-Mangrum, Jadeka A, PMHNP   30 mL at 08/13/24 2039   nicotine polacrilex (NICORETTE) gum 2 mg  2 mg Oral PRN Motley-Mangrum, Jadeka A, PMHNP   2 mg at 08/22/24 1006   sodium chloride (OCEAN) 0.65 % nasal spray 1 spray  1 spray Each Nare PRN Towana Leita SAILOR, MD       traZODone (DESYREL) tablet 50 mg  50 mg Oral QHS PRN Bobbitt, Shalon E, NP   50 mg at 08/21/24 2109    Lab Results: No results found for this or any previous visit (from the past 48 hours).  Blood Alcohol level:  Lab Results  Component Value Date   [facility] <15 08/12/2024   ETH <11 08/02/2011    Metabolic Disorder Labs: Lab Results  Component Value Date   HGBA1C 4.9 08/16/2024   MPG 93.93 08/16/2024   No results found for: PROLACTIN Lab  Results  Component Value Date   CHOL 172 08/16/2024   TRIG 108 08/16/2024   HDL 54 08/16/2024   CHOLHDL 3.2 08/16/2024   VLDL 22 08/16/2024   LDLCALC 96 08/16/2024    Physical Findings: AIMS:  ,  ,  ,  ,  ,  ,   CIWA:    COWS:     Musculoskeletal: Strength & Muscle Tone: within normal limits Gait & Station: normal Patient leans: N/A  Psychiatric Specialty Exam:  Presentation  General Appearance: Casual (Short hair loss which she attributes to being poisoned.)  Eye Contact:Good  Speech: Clear and coherent no pressured  Speech Volume:Decreased (Childlike voice)  Handedness:No data recorded  Mood and Affect  Mood:Anxious  Affect:Labile (Cooperative at times but easily agitated when discussing her sister or perceived mistreatment. Frequently shifts between calmness and distress.)   Thought Process  Thought Processes: Derails  Descriptions of Associations:Loose  Orientation:Full (Time, Place and Person)  Thought Content:Delusions; Paranoid Ideation (Prominent persecutory and somatic delusions (believes her sister has tried to kill her, that chemotherapy was put in her shampoo, and that heroin is being placed in her food). Persistent fixed delusions involving Nicki Minaj present on admission)  History of Schizophrenia/Schizoaffective disorder:Yes  Duration of Psychotic Symptoms:Greater than six months  Hallucinations:No data recorded  Ideas of Reference:Paranoia  Suicidal Thoughts:No data recorded  Homicidal Thoughts:No data recorded   Sensorium  Memory:Immediate Good  Judgment:Poor  Insight:Poor   Executive Functions  Concentration:No data recorded Attention Span:No data recorded Recall:No data recorded Fund of Knowledge:No data recorded Language:Good   Psychomotor Activity  Psychomotor Activity:No data recorded   Assets  Assets:Desire for Improvement   Sleep  Sleep:No data recorded    Physical Exam Constitutional:      General:  She is not in acute distress.    Appearance: Normal appearance. She is normal weight. She is not ill-appearing or toxic-appearing.  HENT:     Head: Normocephalic and atraumatic.  Pulmonary:     Effort: Pulmonary effort is normal.  Neurological:     General: No focal deficit present.    Review of Systems  Gastrointestinal:  Negative for abdominal pain, constipation, diarrhea, nausea and vomiting.   Blood pressure 117/88, pulse 87, temperature 98.1 F (36.7 C), temperature source Oral, resp. rate 16, height 5' 1 (1.549 m), weight 59 kg, SpO2 100%. Body mass index is 24.58 kg/m.   Assessment/Plan: Jatziry Wechter is a 48 year old female with schizoaffective disorder, bipolar type, who continues to exhibit manic symptoms with disorganized thought processes and paranoid delusions. Despite medication adjustments, she remains unstable on the unit, requiring transfer to the 500 hall for closer monitoring and safety.  Given ongoing manic features and poor insight, we will continue Thorazine for psychosis and initiate Depakote for mood stabilization and seizure prophylaxis. The patient has been counseled on medication risks and monitoring. We will continue to monitor behavioral stability, medication adherence, and side effects closely.   Schizoaffective disorder, cocaine use disorder -- Continue Invega Trinza 546 mg once every 12 weeks for schizoaffective; next dose on 10/20/2024 included in discharge orders - 10/9  discontinue Haldol  5 mg 3 times daily for psychosis -10/10 start Depakote 500 mg twice daily for seizure order, patient states she had a tubal ligation and uses condoms - 10/9 Increased Thorazine 50 mg tablets 3 times daily for psychosis, augment Invega - Continue Cogentin 2 mg twice daily, continue to monitor for EPS -- Continue lidocaine patch for R shoulder pain -- [facility] refused patient 10/7, will follow up with patient for long term options, will need to follow up with sister on dispo  planning - Trazodone 50 mg at bedtime as needed for insomnia - Atarax 25 mg TID as needed for anxiety - Agitation Protocol: haldol , benadryl, ativan  Other Medical Conditions - Consider Flagyl, doxycycline, metronidazole or penicillin G for vaginal discharge  Nicotine withdrawal - Patient in need of nicotine replacement; nicotine polacrilex (gum) ordered. Smoking cessation encouraged  Safety and Monitoring: - Voluntary admission to inpatient psychiatric unit for safety, stabilization and treatment - Daily contact with patient to assess and evaluate symptoms and progress in treatment - Patient's case to be discussed in multi-disciplinary team meeting - Observation Level : q15 minute checks - Vital signs:  q12 hours - Precautions: suicide, elopement, and assault  Other as needed medications  Tylenol 650 mg every 6 hours as needed for pain Mylanta 30 mL every 4 hours as needed for indigestion Milk of magnesia 30 mL daily as needed for constipation   The risks/benefits/side-effects/alternatives to the above medication were discussed in detail with the patient and time was given for questions. The patient consents to medication trial. FDA black box warnings, if present, were discussed.  The patient is agreeable with the medication plan, as above. We will monitor the patient's response to pharmacologic treatment, and adjust medications as necessary.  4.   Discharge Planning:  - Social work and case management to assist with discharge planning and identification of hospital follow-up needs prior to discharge - Estimated LOS: 5-7 days  - Discharge Concerns: Need to establish a safety plan; Medication compliance and effectiveness - Discharge Goals: Return home with outpatient referrals for mental health follow-up including medication management/psychotherapy  I certify that inpatient services furnished can reasonably be expected to improve the patient's condition.     Alan Maiden,  MD 08/22/2024, 11:28 AM

## 2024-08-22 NOTE — Progress Notes (Signed)
 Pt refused to take the thorazine as prescribed . Dr. Chandra notified.

## 2024-08-22 NOTE — Plan of Care (Signed)

## 2024-08-23 ENCOUNTER — Encounter (HOSPITAL_COMMUNITY): Payer: Self-pay | Admitting: Psychiatry

## 2024-08-23 DIAGNOSIS — F17203 Nicotine dependence unspecified, with withdrawal: Secondary | ICD-10-CM | POA: Diagnosis not present

## 2024-08-23 DIAGNOSIS — F1490 Cocaine use, unspecified, uncomplicated: Secondary | ICD-10-CM | POA: Diagnosis not present

## 2024-08-23 DIAGNOSIS — F1520 Other stimulant dependence, uncomplicated: Secondary | ICD-10-CM | POA: Diagnosis not present

## 2024-08-23 DIAGNOSIS — F259 Schizoaffective disorder, unspecified: Secondary | ICD-10-CM | POA: Diagnosis not present

## 2024-08-23 MED ORDER — CHLORPROMAZINE HCL 50 MG PO TABS
150.0000 mg | ORAL_TABLET | Freq: Two times a day (BID) | ORAL | Status: DC
  Administered 2024-08-24 – 2024-08-25 (×3): 150 mg via ORAL
  Filled 2024-08-23 (×4): qty 3

## 2024-08-23 MED ORDER — CHLORPROMAZINE HCL 50 MG/2ML IJ SOLN: 150.0000 mg | Freq: Two times a day (BID) | INTRAMUSCULAR | Status: DC

## 2024-08-23 MED ORDER — CHLORPROMAZINE HCL 50 MG PO TABS: 150.0000 mg | ORAL_TABLET | Freq: Two times a day (BID) | ORAL | Status: DC

## 2024-08-23 MED ORDER — CHLORPROMAZINE HCL 50 MG/2ML IJ SOLN
50.0000 mg | Freq: Two times a day (BID) | INTRAMUSCULAR | Status: DC
  Filled 2024-08-23 (×2): qty 2

## 2024-08-23 MED ORDER — DIPHENHYDRAMINE HCL 50 MG/ML IJ SOLN
50.0000 mg | Freq: Once | INTRAMUSCULAR | Status: AC
  Administered 2024-08-23: 50 mg via INTRAVENOUS

## 2024-08-23 MED ORDER — CHLORPROMAZINE HCL 50 MG/2ML IJ SOLN
50.0000 mg | Freq: Two times a day (BID) | INTRAMUSCULAR | Status: DC
  Administered 2024-08-23: 50 mg via INTRAMUSCULAR
  Filled 2024-08-23 (×7): qty 2

## 2024-08-23 MED ORDER — DIPHENHYDRAMINE HCL 50 MG/ML IJ SOLN
50.0000 mg | Freq: Three times a day (TID) | INTRAMUSCULAR | Status: DC | PRN
  Administered 2024-08-24 – 2024-08-25 (×2): 50 mg via INTRAMUSCULAR

## 2024-08-23 MED ORDER — DIPHENHYDRAMINE HCL 50 MG/ML IJ SOLN
50.0000 mg | Freq: Two times a day (BID) | INTRAMUSCULAR | Status: DC
  Administered 2024-08-23: 50 mg via INTRAVENOUS

## 2024-08-23 MED ORDER — DIPHENHYDRAMINE HCL 50 MG/ML IJ SOLN: 50.0000 mg | Freq: Two times a day (BID) | INTRAMUSCULAR | Status: DC

## 2024-08-23 NOTE — Progress Notes (Addendum)
 Pt observed in bed majority of this shift. Noted with blunted affect, increased irritability, agitation, argumentative and abusive with verbal outburst at intervals when awake. Noted having a 2 way conversation with self at medication window on initial contact. Paranoid with flight of ideas; believes staff were trying to kill her I can't even wake up, y'all keep poisoning me with that shit. I called my sister before I die, the police will be right here. Y'all piss on my damn pajamas and put them in the dryer, what the fuck you think I will not find out. Just like those kids that took my damn picture in [place]  when I was using the fucking bathroom and they played it in the fucking court. Pt observed with EPS symptoms this afternoon AEB slurred speech, unable to move her tongue My tongue is heavy I can't move it like you want me to when assessed. Assigned provider notified, Benadryl 50 IM X1 given at 1335 with desired effect when reassessed at 1430. However, pt's mood remains labile, with continued intermittent outburst when approached for care. Threw her evening medications Thorazine 150 mg on the floor in her room I'm not taking that shit, y'all fucking ass think I'm dumb, you trying to fucking kill me. I don't want that shift, get the fuck out of my fucking room. You want to fucking kill my ass. Took IM Thorazine 50 mg and Benandryl 50 mg both IM per [facility] order with show of support. Pt tolerates meals and fluids well. Continued support, encouragement and reassurance offered to pt. Safety checks maintained at Q 15 minutes intervals with increased prompts to comply with care.

## 2024-08-23 NOTE — Progress Notes (Signed)
(  Sleep Hours) -8.75  (Any PRNs that were needed, meds refused, or side effects to meds)- Vistaril 25 mg , Trazodone 50 mg, Flonase  (Any disturbances and when (visitation, over night)- none  (Concerns raised by the patient)- paranoia about being poisoned by staff and being overdosed  (SI/HI/AVH)-AH

## 2024-08-23 NOTE — Plan of Care (Signed)
 Second Opinion for Non Emergent Medications Against Objection  Reason for the Medication: The patient, without the benefit of the specific treatment measure, is incapable of participating in any available treatment plan that will give the patient a realistic opportunity of improving the patient's condition.   Consideration of Side Effects: Consideration of the side effects related to the medication plan has been given.   Rationale for Medication Administration: Patient has shown improvement in past hospitalizations with medication management.  He decompensates after discharge when he becomes medication compliant.    -----   At the present time, patient has no capacity to understand the need for treatment, refusing to take psychotropic medication, because of poor insight and judgment.  Patient states that they have no problem, no mental illness and refuses to consider taking medication, recommended by the psychiatrist Dr. Elodie and Dr. Chandra.  The patient reports that staff are trying to poison her with an overdose.  She reports that the food provided is poisonous with Heroin in it.  She reports that her clothes are not being washed and only having urine poured on them then dried because everything smells like piss.  Diagnosis: Schizoaffective Disorder   Treatment plan/Opinion: -patient is mentally ill, as needs hospitalization -patient continues to display active symptoms of Schizoaffective Disorder -patient lacks the capacity to consent to treatment with medication Thorazine  -patient is unable to rationally discuss medication options, risks versus benefit due to their symptoms. -treatment with medication and medication changes would be in their best interest -psychotropics are effective treatment for symptoms of psychosis   Based on my evaluation, the following medications are recommend and indicated for treatment of the patient's psychiatric diagnosis and symptoms:  Thorazine PO and  if she refuses IM   Alex Kemper Hochman DO

## 2024-08-23 NOTE — Plan of Care (Signed)
   Problem: Activity: Goal: Sleeping patterns will improve Outcome: Progressing   Problem: Safety: Goal: Periods of time without injury will increase Outcome: Progressing

## 2024-08-23 NOTE — Plan of Care (Signed)
 Nursing reported that patient is experiencing EPS in the tongue, heavy tongue.  I evaluated the patient and she has no tremors, cogwheeling, stiffness in the legs or arms and her gait is within normal limits.  There is no abnormal perioral or facial movements.  The patient complains of having a heavy tongue.  I prescribed Benadryl 50 mg IM once and we will see if she improves.  If patient has repeated symptoms then we will discontinue Thorazine in favor of fluphenazine.

## 2024-08-23 NOTE — Tx Team (Signed)
 Initial Treatment Plan 08/23/2024 7:01 PM Nancy Howell FMW:[id number]    PATIENT STRESSORS: Financial difficulties   Loss of father (He died a year ago)   Substance abuse     PATIENT STRENGTHS: Capable of independent living  Communication skills  Supportive family/friends    PATIENT IDENTIFIED PROBLEMS: Polysubstance Abuse I use cocaine, weed & Fentany.    Grieving My dad died a year ago and I started using more drugs.    Homelessness             DISCHARGE CRITERIA:  Improved stabilization in mood, thinking, and/or behavior Verbal commitment to aftercare and medication compliance Withdrawal symptoms are absent or subacute and managed without 24-hour nursing intervention  PRELIMINARY DISCHARGE PLAN: Outpatient therapy Placement in alternative living arrangements  PATIENT/FAMILY INVOLVEMENT: This treatment plan has been presented to and reviewed with the patient, Nancy Howell.  The patient and family have been given the opportunity to ask questions and make suggestions.  Anneliese Leblond, RN 08/23/2024, 7:01 PM

## 2024-08-23 NOTE — Progress Notes (Addendum)
 [facility] MD Progress Note  08/23/2024 12:46 PM Nancy Howell  MRN:  [id number]  Subjective:   Nancy Howell is a 48 year old female with a past psychiatric history of schizoaffective disorder bipolar type, GAD, and cocaine use disorder with recent cocaine use who presented to ED on 08/12/24 for suicidal ideation. She is currently admitted on a voluntary basis to the behavioral health hospital.   Case was discussed in the multidisciplinary team. MAR was reviewed and patient was not compliant with medications as patient refused to take thorazine.   Psychiatric Team made the following recommendations yesterday: - Start Depakote 500 mg twice daily for seizure order, patient states she had a tubal ligation and uses condoms  Patient Report: Despite medication adjustments, the patient continues to deteriorate and has been increasingly manic, disorganized, and aggressive over the past day. She refused to comply with the interview when counseled on the need for medication adherence. The patient continues to express paranoid delusions, stating that she is being poisoned and that staff are attempting to "drug her to take the family business." During the encounter, she became verbally aggressive toward providers and told them to "fuck off."   Principal Problem: Severe stimulant use disorder (HCC) Diagnosis: Principal Problem:   Severe stimulant use disorder (HCC) Active Problems:   GAD (generalized anxiety disorder)   Schizoaffective disorder (HCC)  Total Time spent with patient: 35 minutes  Past Psychiatric History:  Previous psychiatric diagnoses: Schizoaffective disorder, Bipolar I Disorder, Substance induce mood disorder, GAD  Prior psychiatric treatment: Zyprexa, Depakote and Haldol , Cogentin Psychiatric medication compliance history: Noncompliant  Current psychiatric treatment: Invega Trinza 546 MG/1.75ML injection given on 07/28/2024 -- next dose 10/19/2024.  Current psychiatrist: None Current therapist:  None  Previous hospitalizations: Recent hospitalization at [facility] [place] [facility] where she was discharged on [place]  History of suicide attempts: Denies History of self harm: Denies   Past Medical History:  Past Medical History:  Diagnosis Date   Bipolar 1 disorder (HCC)    Schizo affective schizophrenia (HCC)    History reviewed. No pertinent surgical history. Family History: History reviewed. No pertinent family history. Family Psychiatric  History: None reported Social History:  Social History   Substance and Sexual Activity  Alcohol Use Yes   Comment: socially     Social History   Substance and Sexual Activity  Drug Use No    Social History   Socioeconomic History   Marital status: Single    Spouse name: Not on file   Number of children: Not on file   Years of education: Not on file   Highest education level: Not on file  Occupational History   Not on file  Tobacco Use   Smoking status: Every Day    Current packs/day: 0.50    Types: Cigarettes   Smokeless tobacco: Never  Substance and Sexual Activity   Alcohol use: Yes    Comment: socially   Drug use: No   Sexual activity: Not on file  Other Topics Concern   Not on file  Social History Narrative   Not on file   Social Drivers of Health   Financial Resource Strain: Not on file  Food Insecurity: No Food Insecurity (08/12/2024)   Hunger Vital Sign    Worried About Running Out of Food in the Last Year: Never true    Ran Out of Food in the Last Year: Never true  Transportation Needs: No Transportation Needs (08/12/2024)   PRAPARE - Transportation    Lack of  Transportation (Medical): No    Lack of Transportation (Non-Medical): No  Physical Activity: Not on file  Stress: Not on file  Social Connections: Not on file   Additional Social History:                         Sleep: Good Estimated Sleeping Duration (Last 24 Hours): 6.50-7.00 hours  Appetite:  Good  Current  Medications: Current Facility-Administered Medications  Medication Dose Route Frequency Provider Last Rate Last Admin   acetaminophen (TYLENOL) tablet 1,000 mg  1,000 mg Oral Q6H PRN McCarty, Artie, MD   1,000 mg at 08/23/24 0918   alum & mag hydroxide-simeth (MAALOX/MYLANTA) 200-200-20 MG/5ML suspension 30 mL  30 mL Oral Q4H PRN Motley-Mangrum, Jadeka A, PMHNP       benztropine (COGENTIN) tablet 2 mg  2 mg Oral BID Marry Clamp, MD   2 mg at 08/23/24 0915   chlorproMAZINE (THORAZINE) tablet 150 mg  150 mg Oral BID Elodie Palma, MD       Or   chlorproMAZINE (THORAZINE) injection 50 mg  50 mg Intramuscular BID Elodie Palma, MD       Or   diphenhydrAMINE (BENADRYL) injection 50 mg  50 mg Intravenous BID Elodie Palma, MD       haloperidol  (HALDOL ) tablet 5 mg  5 mg Oral TID PRN Motley-Mangrum, Jadeka A, PMHNP   5 mg at 08/19/24 1546   And   diphenhydrAMINE (BENADRYL) capsule 50 mg  50 mg Oral TID PRN Motley-Mangrum, Jadeka A, PMHNP   50 mg at 08/19/24 1546   haloperidol  lactate (HALDOL ) injection 5 mg  5 mg Intramuscular TID PRN Motley-Mangrum, Jadeka A, PMHNP       And   diphenhydrAMINE (BENADRYL) injection 50 mg  50 mg Intramuscular TID PRN Motley-Mangrum, Jadeka A, PMHNP       And   LORazepam (ATIVAN) injection 2 mg  2 mg Intramuscular TID PRN Motley-Mangrum, Jadeka A, PMHNP       haloperidol  lactate (HALDOL ) injection 10 mg  10 mg Intramuscular TID PRN Motley-Mangrum, Jadeka A, PMHNP       And   diphenhydrAMINE (BENADRYL) injection 50 mg  50 mg Intramuscular TID PRN Motley-Mangrum, Jadeka A, PMHNP       And   LORazepam (ATIVAN) injection 2 mg  2 mg Intramuscular TID PRN Motley-Mangrum, Jadeka A, PMHNP       divalproex (DEPAKOTE) DR tablet 500 mg  500 mg Oral Q12H Elodie Palma, MD   500 mg at 08/23/24 0915   feeding supplement (ENSURE PLUS HIGH PROTEIN) liquid 237 mL  237 mL Oral BID BM Marry Clamp, MD   237 mL at 08/22/24 1618   fluticasone (FLONASE) 50 MCG/ACT nasal  spray 1 spray  1 spray Each Nare Q12H PRN Towana Leita SAILOR, MD   1 spray at 08/22/24 2029   fluticasone (FLONASE) 50 MCG/ACT nasal spray 2 spray  2 spray Each Nare Daily McCarty, Artie, MD   2 spray at 08/23/24 0915   hydrOXYzine (ATARAX) tablet 25 mg  25 mg Oral TID PRN Motley-Mangrum, Jadeka A, PMHNP   25 mg at 08/22/24 2030   ibuprofen (ADVIL) tablet 600 mg  600 mg Oral Q6H PRN McCarty, Artie, MD   600 mg at 08/23/24 0701   lidocaine (LIDODERM) 5 % 1 patch  1 patch Transdermal Daily Mannie Ashley SAILOR, MD   1 patch at 08/23/24 0916   magnesium hydroxide (MILK OF MAGNESIA) suspension 30 mL  30 mL Oral Daily PRN Motley-Mangrum, Jadeka A, PMHNP   30 mL at 08/13/24 2039   nicotine polacrilex (NICORETTE) gum 2 mg  2 mg Oral PRN Motley-Mangrum, Jadeka A, PMHNP   2 mg at 08/22/24 2030   sodium chloride (OCEAN) 0.65 % nasal spray 1 spray  1 spray Each Nare PRN Towana Leita SAILOR, MD       traZODone (DESYREL) tablet 50 mg  50 mg Oral QHS PRN Bobbitt, Shalon E, NP   50 mg at 08/22/24 2030    Lab Results: No results found for this or any previous visit (from the past 48 hours).  Blood Alcohol level:  Lab Results  Component Value Date   [facility] <15 08/12/2024   ETH <11 08/02/2011    Metabolic Disorder Labs: Lab Results  Component Value Date   HGBA1C 4.9 08/16/2024   MPG 93.93 08/16/2024   No results found for: PROLACTIN Lab Results  Component Value Date   CHOL 172 08/16/2024   TRIG 108 08/16/2024   HDL 54 08/16/2024   CHOLHDL 3.2 08/16/2024   VLDL 22 08/16/2024   LDLCALC 96 08/16/2024    Physical Findings: AIMS:  ,  ,  ,  ,  ,  ,   CIWA:    COWS:     Musculoskeletal: Strength & Muscle Tone: within normal limits Gait & Station: normal Patient leans: N/A  Psychiatric Specialty Exam:  Presentation  General Appearance: Bizarre  Eye Contact:Good  Speech: Pressured  Speech Volume:Increased  Handedness:No data recorded  Mood and Affect  Mood:Angry;  Irritable  Affect:Labile   Thought Process  Thought Processes: Derails  Descriptions of Associations:Tangential  Orientation:Full (Time, Place and Person)  Thought Content:Delusions; Paranoid Ideation  History of Schizophrenia/Schizoaffective disorder:Yes  Duration of Psychotic Symptoms:Greater than six months  Hallucinations:Hallucinations: None   Suicidal Thoughts:Suicidal Thoughts: No   Homicidal Thoughts:Homicidal Thoughts: No    Sensorium  Memory:Immediate Good  Judgment:Poor  Insight:Poor   Executive Functions  Language:Good   Psychomotor Activity  Psychomotor Activity:Psychomotor Activity: Normal    Assets  Assets:Housing; Communication Skills   Sleep  Sleep:Sleep: Good    Physical Exam Constitutional:      General: She is not in acute distress.    Appearance: Normal appearance. She is normal weight. She is not ill-appearing or toxic-appearing.  HENT:     Head: Normocephalic and atraumatic.  Pulmonary:     Effort: Pulmonary effort is normal.  Neurological:     General: No focal deficit present.    Review of Systems  Gastrointestinal:  Negative for abdominal pain, constipation, diarrhea, nausea and vomiting.   Blood pressure 113/88, pulse 96, temperature 98.1 F (36.7 C), temperature source Oral, resp. rate 16, height 5' 1 (1.549 m), weight 59 kg, SpO2 100%. Body mass index is 24.58 kg/m.   Assessment/Plan: Dhriti Fales is a 48 year old female with schizoaffective disorder, bipolar type, who is currently deteriorating and presenting with worsening mania and psychosis. She remains noncompliant with medications, highly paranoid, and unable to engage meaningfully in treatment.  A second opinion was provided by Dr. Raliegh on 10/11, who determined that the patient is incapable of participating in any available treatment plan that would provide a realistic opportunity for improvement. Based on this evaluation, forced medications were  approved and initiated. We will plan to increase thorazine from 50 mg three times daily to 150 mg twice daily, with forced medications on board if patient is not compliant.    Schizoaffective disorder, cocaine use disorder -- Continue  Invega Trinza 546 mg once every 12 weeks for schizoaffective; next dose on 10/20/2024 included in discharge orders -10/11 Increased Thorazine from 50 mg tablets 3 times daily to 150 mg tablets twice daily for psychosis to augment Invega -10/11 per second opinion of Dr. Raliegh, start forced medications of Thorazine 150 mg tablets twice daily by mouth or Thorazine 50 mg IM plus Benadryl 50 mg IM if patient does not take Thorazine 150 mg tablets by mouth -10/10 start Depakote 500 mg twice daily for seizure order, patient states she had a tubal ligation and uses condoms - Continue Cogentin 2 mg twice daily, continue to monitor for EPS -- Continue lidocaine patch for R shoulder pain -- [facility] refused patient 10/7, will follow up with patient for long term options, will need to follow up with sister on dispo planning - Trazodone 50 mg at bedtime as needed for insomnia - Atarax 25 mg TID as needed for anxiety - Agitation Protocol: haldol , benadryl, ativan  Other Medical Conditions - Consider Flagyl, doxycycline, metronidazole or penicillin G for vaginal discharge  Nicotine withdrawal - Patient in need of nicotine replacement; nicotine polacrilex (gum) ordered. Smoking cessation encouraged  Safety and Monitoring: - Voluntary admission to inpatient psychiatric unit for safety, stabilization and treatment - Daily contact with patient to assess and evaluate symptoms and progress in treatment - Patient's case to be discussed in multi-disciplinary team meeting - Observation Level : q15 minute checks - Vital signs:  q12 hours - Precautions: suicide, elopement, and assault  Other as needed medications  Tylenol 650 mg every 6 hours as needed for pain Mylanta 30 mL  every 4 hours as needed for indigestion Milk of magnesia 30 mL daily as needed for constipation   The risks/benefits/side-effects/alternatives to the above medication were discussed in detail with the patient and time was given for questions. The patient consents to medication trial. FDA black box warnings, if present, were discussed.  The patient is agreeable with the medication plan, as above. We will monitor the patient's response to pharmacologic treatment, and adjust medications as necessary.  4.   Discharge Planning:  - Social work and case management to assist with discharge planning and identification of hospital follow-up needs prior to discharge - Estimated LOS: 5-7 days  - Discharge Concerns: Need to establish a safety plan; Medication compliance and effectiveness - Discharge Goals: Return home with outpatient referrals for mental health follow-up including medication management/psychotherapy  I certify that inpatient services furnished can reasonably be expected to improve the patient's condition.     Alan Maiden, MD 08/23/2024, 12:46 PM

## 2024-08-23 NOTE — Plan of Care (Signed)
   Problem: Activity: Goal: Sleeping patterns will improve Outcome: Progressing   Problem: Safety: Goal: Periods of time without injury will increase Outcome: Progressing   Problem: Education: Goal: Emotional status will improve Outcome: Not Progressing Goal: Mental status will improve Outcome: Not Progressing   Problem: Activity: Goal: Interest or engagement in activities will improve Outcome: Not Progressing

## 2024-08-23 NOTE — [facility] Group Notes (Signed)
 Adult Psychoeducational Group Note  Date:  08/23/2024 Time:  8:31 PM  Group Topic/Focus:  Wrap-Up Group:   The focus of this group is to help patients review their daily goal of treatment and discuss progress on daily workbooks.  Participation Level:  Did Not Attend  Nancy Howell 08/23/2024, 8:31 PM

## 2024-08-23 NOTE — Plan of Care (Addendum)
 If patient experiences EPS after her evening thorazine dose at 1700, then deliver Benadryl 50mg  IM and contact on-call psychiatrist. Also please hold morning dose of thorazine. Benadryl 50mg  IM has been ordered PRN q8hrs for EPS, Allergies and Itching. Discussed with both nurses on [address] hall and on-call psychiatrist.

## 2024-08-24 DIAGNOSIS — F1490 Cocaine use, unspecified, uncomplicated: Secondary | ICD-10-CM | POA: Diagnosis not present

## 2024-08-24 DIAGNOSIS — F1520 Other stimulant dependence, uncomplicated: Secondary | ICD-10-CM | POA: Diagnosis not present

## 2024-08-24 DIAGNOSIS — F259 Schizoaffective disorder, unspecified: Secondary | ICD-10-CM | POA: Diagnosis not present

## 2024-08-24 DIAGNOSIS — F17203 Nicotine dependence unspecified, with withdrawal: Secondary | ICD-10-CM | POA: Diagnosis not present

## 2024-08-24 NOTE — Progress Notes (Signed)
 [facility] MD Progress Note  08/24/2024 2:38 PM Nancy Howell  MRN:  [id number]  Subjective:   Nancy Howell is a 48 year old female with a past psychiatric history of Schizoaffective Disorder, Bipolar Type; Generalized Anxiety Disorder (GAD); and Cocaine Use Disorder (with recent use), who initially presented to the ED on 08/12/24 for suicidal ideation. She was admitted voluntarily to the [facility]. On 10/11, the patient was IVC'd, upgraded to [facility], and placed on forced medication protocol due to escalating agitation, disruptive behavior, and worsening paranoid delusions.  The case was reviewed during the multidisciplinary team meeting. Nursing staff reported that the patient experienced tongue heaviness suggestive of possible EPS. On evaluation, no tremors, rigidity, cogwheeling, or gait abnormalities were observed. There were no abnormal perioral or facial movements. The patient was administered IM Benadryl 50 mg, with PRN orders for recurrence of symptoms.  Per psychiatric recommendations from yesterday: - Thorazine increased from 50 mg PO TID to 150 mg PO BID for psychosis, to augment Invega. - Per second opinion from Dr. Raliegh, forced medication protocol initiated: Thorazine 150 mg PO BID or, if refused, Thorazine 50 mg IM + Benadryl 50 mg IM.  During today's evaluation, the patient was found lying in bed, mumbling and stating she "cannot talk because she is having a stroke." When questioned about her tongue, she reported having sores in her mouth and throat; however, examination revealed no visible lesions. She also reported diffuse body pain extending "from my arm all the way down to my toes" and described feeling "strokelike symptoms" from the medication.  Currently, the patient is no longer agitated or aggressive but continues to display paranoid delusions related to prior drug use. She denies SI, HI, and AVH.    Principal Problem: Severe stimulant use disorder (HCC) Diagnosis:  Principal Problem:   Severe stimulant use disorder (HCC) Active Problems:   GAD (generalized anxiety disorder)   Schizoaffective disorder (HCC)  Total Time spent with patient: 35 minutes  Past Psychiatric History:  Previous psychiatric diagnoses: Schizoaffective disorder, Bipolar I Disorder, Substance induce mood disorder, GAD  Prior psychiatric treatment: Zyprexa, Depakote and Haldol , Cogentin Psychiatric medication compliance history: Noncompliant  Current psychiatric treatment: Invega Trinza 546 MG/1.75ML injection given on 07/28/2024 -- next dose 10/19/2024.  Current psychiatrist: None Current therapist: None  Previous hospitalizations: Recent hospitalization at [facility] where she was discharged on [place]  History of suicide attempts: Denies History of self harm: Denies   Past Medical History:  Past Medical History:  Diagnosis Date   Bipolar 1 disorder (HCC)    Schizo affective schizophrenia (HCC)    History reviewed. No pertinent surgical history. Family History: History reviewed. No pertinent family history. Family Psychiatric  History: None reported Social History:  Social History   Substance and Sexual Activity  Alcohol Use Yes   Comment: socially     Social History   Substance and Sexual Activity  Drug Use No    Social History   Socioeconomic History   Marital status: Single    Spouse name: Not on file   Number of children: Not on file   Years of education: Not on file   Highest education level: Not on file  Occupational History   Not on file  Tobacco Use   Smoking status: Every Day    Current packs/day: 0.50    Types: Cigarettes   Smokeless tobacco: Never  Substance and Sexual Activity   Alcohol use: Yes    Comment: socially   Drug use: No  Sexual activity: Not on file  Other Topics Concern   Not on file  Social History Narrative   Not on file   Social Drivers of Health   Financial Resource Strain: Not on file  Food  Insecurity: No Food Insecurity (08/12/2024)   Hunger Vital Sign    Worried About Running Out of Food in the Last Year: Never true    Ran Out of Food in the Last Year: Never true  Transportation Needs: No Transportation Needs (08/12/2024)   PRAPARE - Administrator, Civil Service (Medical): No    Lack of Transportation (Non-Medical): No  Physical Activity: Not on file  Stress: Not on file  Social Connections: Not on file   Additional Social History:                         Sleep: Good Estimated Sleeping Duration (Last 24 Hours): 8.25-9.25 hours  Appetite:  Good  Current Medications: Current Facility-Administered Medications  Medication Dose Route Frequency Provider Last Rate Last Admin   acetaminophen (TYLENOL) tablet 1,000 mg  1,000 mg Oral Q6H PRN McCarty, Artie, MD   1,000 mg at 08/23/24 0918   alum & mag hydroxide-simeth (MAALOX/MYLANTA) 200-200-20 MG/5ML suspension 30 mL  30 mL Oral Q4H PRN Motley-Mangrum, Jadeka A, PMHNP       benztropine (COGENTIN) tablet 2 mg  2 mg Oral BID Marry Clamp, MD   2 mg at 08/24/24 0756   chlorproMAZINE (THORAZINE) tablet 150 mg  150 mg Oral BID AC Chandra Charleston Sherlean Ruth, DO   150 mg at 08/24/24 9380   Or   chlorproMAZINE (THORAZINE) injection 50 mg  50 mg Intramuscular BID AC Chandra Charleston Sherlean Ruth, DO   50 mg at 08/23/24 8177   Or   diphenhydrAMINE (BENADRYL) injection 50 mg  50 mg Intravenous BID AC Chandra Charleston Christian Lee, DO   50 mg at 08/23/24 1824   haloperidol  (HALDOL ) tablet 5 mg  5 mg Oral TID PRN Motley-Mangrum, Jadeka A, PMHNP   5 mg at 08/19/24 1546   And   diphenhydrAMINE (BENADRYL) capsule 50 mg  50 mg Oral TID PRN Motley-Mangrum, Jadeka A, PMHNP   50 mg at 08/19/24 1546   haloperidol  lactate (HALDOL ) injection 5 mg  5 mg Intramuscular TID PRN Motley-Mangrum, Jadeka A, PMHNP       And   diphenhydrAMINE (BENADRYL) injection 50 mg  50 mg Intramuscular TID PRN Motley-Mangrum, Jadeka A, PMHNP        And   LORazepam (ATIVAN) injection 2 mg  2 mg Intramuscular TID PRN Motley-Mangrum, Jadeka A, PMHNP       haloperidol  lactate (HALDOL ) injection 10 mg  10 mg Intramuscular TID PRN Motley-Mangrum, Jadeka A, PMHNP       And   diphenhydrAMINE (BENADRYL) injection 50 mg  50 mg Intramuscular TID PRN Motley-Mangrum, Jadeka A, PMHNP       And   LORazepam (ATIVAN) injection 2 mg  2 mg Intramuscular TID PRN Motley-Mangrum, Jadeka A, PMHNP       diphenhydrAMINE (BENADRYL) injection 50 mg  50 mg Intramuscular Q8H PRN Chandra Charleston Sherlean Ruth, DO       divalproex (DEPAKOTE) DR tablet 500 mg  500 mg Oral Q12H Elodie Palma, MD   500 mg at 08/24/24 0756   feeding supplement (ENSURE PLUS HIGH PROTEIN) liquid 237 mL  237 mL Oral BID BM Marry Clamp, MD   237 mL at 08/24/24 1100  fluticasone (FLONASE) 50 MCG/ACT nasal spray 1 spray  1 spray Each Nare Q12H PRN Towana Leita SAILOR, MD   1 spray at 08/22/24 2029   fluticasone (FLONASE) 50 MCG/ACT nasal spray 2 spray  2 spray Each Nare Daily McCarty, Artie, MD   2 spray at 08/24/24 0758   hydrOXYzine (ATARAX) tablet 25 mg  25 mg Oral TID PRN Motley-Mangrum, Jadeka A, PMHNP   25 mg at 08/24/24 0824   ibuprofen (ADVIL) tablet 600 mg  600 mg Oral Q6H PRN McCarty, Artie, MD   600 mg at 08/24/24 0756   lidocaine (LIDODERM) 5 % 1 patch  1 patch Transdermal Daily Mannie Ashley SAILOR, MD   1 patch at 08/24/24 0756   magnesium hydroxide (MILK OF MAGNESIA) suspension 30 mL  30 mL Oral Daily PRN Motley-Mangrum, Jadeka A, PMHNP   30 mL at 08/13/24 2039   nicotine polacrilex (NICORETTE) gum 2 mg  2 mg Oral PRN Motley-Mangrum, Jadeka A, PMHNP   2 mg at 08/24/24 1231   sodium chloride (OCEAN) 0.65 % nasal spray 1 spray  1 spray Each Nare PRN Towana Leita SAILOR, MD       traZODone (DESYREL) tablet 50 mg  50 mg Oral QHS PRN Bobbitt, Shalon E, NP   50 mg at 08/23/24 2049    Lab Results: No results found for this or any previous visit (from the past 48 hours).  Blood Alcohol level:   Lab Results  Component Value Date   [facility] <15 08/12/2024   ETH <11 08/02/2011    Metabolic Disorder Labs: Lab Results  Component Value Date   HGBA1C 4.9 08/16/2024   MPG 93.93 08/16/2024   No results found for: PROLACTIN Lab Results  Component Value Date   CHOL 172 08/16/2024   TRIG 108 08/16/2024   HDL 54 08/16/2024   CHOLHDL 3.2 08/16/2024   VLDL 22 08/16/2024   LDLCALC 96 08/16/2024    Physical Findings: AIMS:  ,  ,  ,  ,  ,  ,   CIWA:    COWS:     Musculoskeletal: Strength & Muscle Tone: within normal limits Gait & Station: normal Patient leans: N/A  Psychiatric Specialty Exam:  Presentation  General Appearance: Bizarre  Eye Contact:Good  Speech: Pressured  Speech Volume:Normal  Handedness:No data recorded  Mood and Affect  Mood:-- (I feel stressed because I'm having a stroke)  Affect:Flat   Thought Process  Thought Processes: Derails  Descriptions of Associations:Tangential  Orientation:Full (Time, Place and Person)  Thought Content:Paranoid Ideation; Delusions  History of Schizophrenia/Schizoaffective disorder:Yes  Duration of Psychotic Symptoms:Greater than six months  Hallucinations:Hallucinations: None   Suicidal Thoughts:Suicidal Thoughts: No   Homicidal Thoughts:Homicidal Thoughts: No    Sensorium  Memory:Immediate Good  Judgment:Poor  Insight:Poor   Executive Functions  Language:Good   Psychomotor Activity  Psychomotor Activity:Psychomotor Activity: Decreased (Tongue heaviness reported, possible EPS)    Assets  Assets:Communication Skills   Sleep  Sleep:Sleep: Fair    Physical Exam Constitutional:      General: She is not in acute distress.    Appearance: Normal appearance. She is normal weight. She is not ill-appearing or toxic-appearing.  HENT:     Head: Normocephalic and atraumatic.  Pulmonary:     Effort: Pulmonary effort is normal.  Neurological:     General: No focal deficit present.     Review of Systems  Gastrointestinal:  Negative for abdominal pain, constipation, diarrhea, nausea and vomiting.   Blood pressure (!) 101/90, pulse ROLLEN)  108, temperature 98 F (36.7 C), temperature source Oral, resp. rate 14, height 5' 1 (1.549 m), weight 59 kg, SpO2 100%. Body mass index is 24.58 kg/m.   Assessment/Plan: Nancy Howell is a 48 year old female with schizoaffective disorder, bipolar type, who is currently deteriorating and presenting with worsening mania and psychosis. She remains noncompliant with medications, highly paranoid, and unable to engage meaningfully in treatment.  Today, she presents with somatic delusions (believing she is having a stroke) and subjective tongue heaviness possibly secondary to EPS. No objective neurological deficits or EPS findings were observed on examination. The patient's psychotic symptoms have improved in terms of behavioral control, though paranoid ideation persists.  Given the reported tongue symptoms, she was treated with IM Benadryl, which will remain available PRN. Her current presentation does not suggest an acute neurological event. She remains stable on forced medication protocol with gradual improvement in agitation. We will continue to monitor daily for any EPS symptoms with plans to discontinue Thorazine in favor of fluphenazine if EPS symptoms arise.    Schizoaffective disorder, cocaine use disorder -- Continue Invega Trinza 546 mg once every 12 weeks for schizoaffective; next dose on 10/20/2024 included in discharge orders -10/11 Increased Thorazine from 50 mg tablets 3 times daily to 150 mg tablets twice daily for psychosis to augment Invega -10/11 per second opinion of Dr. Raliegh, start forced medications of Thorazine 150 mg tablets twice daily by mouth or Thorazine 50 mg IM plus Benadryl 50 mg IM if patient does not take Thorazine 150 mg tablets by mouth -10/10 start Depakote 500 mg twice daily for seizure order, patient states she  had a tubal ligation and uses condoms - Continue Cogentin 2 mg twice daily, continue to monitor for EPS -- Continue lidocaine patch for R shoulder pain -- [facility] refused patient 10/7, will follow up with patient for long term options, will need to follow up with sister on dispo planning - Trazodone 50 mg at bedtime as needed for insomnia - Atarax 25 mg TID as needed for anxiety - Agitation Protocol: haldol , benadryl, ativan  Other Medical Conditions - Consider Flagyl, doxycycline, metronidazole or penicillin G for vaginal discharge  Nicotine withdrawal - Patient in need of nicotine replacement; nicotine polacrilex (gum) ordered. Smoking cessation encouraged  Safety and Monitoring: - Voluntary admission to inpatient psychiatric unit for safety, stabilization and treatment - Daily contact with patient to assess and evaluate symptoms and progress in treatment - Patient's case to be discussed in multi-disciplinary team meeting - Observation Level : q15 minute checks - Vital signs:  q12 hours - Precautions: suicide, elopement, and assault  Other as needed medications  Tylenol 650 mg every 6 hours as needed for pain Mylanta 30 mL every 4 hours as needed for indigestion Milk of magnesia 30 mL daily as needed for constipation   The risks/benefits/side-effects/alternatives to the above medication were discussed in detail with the patient and time was given for questions. The patient consents to medication trial. FDA black box warnings, if present, were discussed.  The patient is agreeable with the medication plan, as above. We will monitor the patient's response to pharmacologic treatment, and adjust medications as necessary.  4.   Discharge Planning:  - Social work and case management to assist with discharge planning and identification of hospital follow-up needs prior to discharge - Estimated LOS: 5-7 days  - Discharge Concerns: Need to establish a safety plan; Medication compliance and  effectiveness - Discharge Goals: Return home with outpatient referrals for mental health  follow-up including medication management/psychotherapy  I certify that inpatient services furnished can reasonably be expected to improve the patient's condition.     Alan Maiden, MD 08/24/2024, 2:38 PM

## 2024-08-24 NOTE — Plan of Care (Addendum)
 Pt reports she slept well last night with fair appetite. Presents with blunted affect, irritable mood, tangential still but with decreased verbal outburst and threats related to paranoia about being killed by staff. Visible in dayroom at brief intervals for snacks I can't talk, I'm not doing group and walked to her room when prompted. PRN Benadryl 50 IM given for EPS symptoms, AEB by slurred, difficult speech. Pt tolerated meals and fluids well. Attended to her ADLS. Safety checks maintained at Q 15 minutes intervals. Pt is verbally redirectable at this time. Support, encouragement and reassurance offered.    Problem: Health Behavior/Discharge Planning: Goal: Compliance with treatment plan for underlying cause of condition will improve Outcome: Progressing   Problem: Safety: Goal: Periods of time without injury will increase Outcome: Progressing

## 2024-08-24 NOTE — BHH Group Notes (Signed)
 Adult Psychoeducational Group Note  Date:  08/24/2024 Time:  6:41 PM  Group Topic/Focus:  Goals Group:   The focus of this group is to help patients establish daily goals to achieve during treatment and discuss how the patient can incorporate goal setting into their daily lives to aide in recovery.  Participation Level:  na  Participation Quality:  na  Affect:  na  Cognitive:  na  Insight: na  Engagement in Group:  na  Modes of Intervention:  na  Additional Comments:  The patient did not attend group.  Laterrance Nauta Lee 08/24/2024, 6:41 PM

## 2024-08-25 MED ORDER — CHLORPROMAZINE HCL 50 MG/2ML IJ SOLN
50.0000 mg | Freq: Two times a day (BID) | INTRAMUSCULAR | Status: DC
  Filled 2024-08-25 (×4): qty 2

## 2024-08-25 MED ORDER — CHLORPROMAZINE HCL 50 MG PO TABS
50.0000 mg | ORAL_TABLET | Freq: Two times a day (BID) | ORAL | Status: DC
Start: 2024-08-25 — End: 2024-08-26
  Filled 2024-08-25: qty 1

## 2024-08-25 MED ORDER — DIPHENHYDRAMINE HCL 50 MG/ML IJ SOLN
50.0000 mg | Freq: Two times a day (BID) | INTRAMUSCULAR | Status: DC
  Filled 2024-08-25: qty 1

## 2024-08-25 NOTE — Progress Notes (Signed)
 1603 patient notified nurse of increasing tightness of jaw and trouble speaking. Provider Alan Maiden MD was Notified and patient was given PRN Benadryl IM as ordered and Provider informed nurse to hold next dose of Thorazine.

## 2024-08-25 NOTE — Progress Notes (Signed)
 [facility] Inpatient Psychiatry Progress Note  Date: 08/25/2024 Patient: Nancy Howell MRN: [id number]  Nancy Howell is a 48 y.o. F admitted for suicidal ideation.  Assessment and Plan:  Nancy Howell is a 48 year old female with schizoaffective disorder, bipolar type, who was previously exhibiting worsening mania and psychosis. The patient has shown notable improvement in agitation and behavioral control on Thorazine, as she is now verbally redirectable and no longer aggressive or threatening toward staff.  Despite this progress, she continues to experience delusions of staff poisoning her and demonstrates possible EPS including tongue and jaw tightness. She remains irritable but is no longer aggressive.  Will plan to hold evening dose of Thorazine, decrease dose of thorazine, and monitor for improvement. If EPS symptoms persist, consider discontinuing Thorazine and initiating fluphenazine for continued management of psychosis and mood stabilization.  # Schizoaffective disorder, cocaine use disorder -Decrease thorazine from 150 mg tablets two times daily to 50mg  tablets twice daily due to possible EPS -Continue Depakote 500 mg twice daily for seizure disorder, patient states she had a tubal ligation and uses condoms -Continue Cogentin 2 mg twice daily -10/11: Per second opinion of Dr. Raliegh, forced medications of Thorazine tablets twice daily by mouth or Thorazine 50 mg IM plus Benadryl 50 mg IM if patient does not take Thorazine tablets by mouth  # Nicotine Withdrawal - Patient in need of nicotine replacement; nicotine polacrilex (gum) ordered. Smoking cessation encouraged   Risk Assessment: Patient continues to be gravely disabled and unable to care for self due to severe and impairing psychosis.  Discharge Planning: Barriers to Discharge: Medication management Estimated Length of Stay: 7-10 days Predicted Discharge Location: Home to sister's house  Interval  History: Chart reviewed. No significant events overnight. PRNs in the last 24 hours include Benadryl for continued tongue heaviness.   On assessment today, the patient is found laying in bed eating food without difficulty. Speech continues to be slurred due reported tongue heaviness. She reports persistent pain radiating from arm down to leg. She denies SI, HI, and AVH.   Physical Examination:  Vitals and nursing note reviewed  Musculoskeletal: Normal gait and station Strength & Muscle Tone: within normal limits Gait & Station: normal  Mental Status Exam:  Appearance: Appropriate for environment  Behavior: Calm, cooperative during assessment, no psychomotor agitation or retardation observed  Attitude: Guarded but less hostile compared to prior encounters  Speech: Decreased volume, slurred, and difficult to understand due to reported tongue heaviness.  Mood: Irritated  Affect: Restricted range, congruent with stated mood.  Thought Process: Disorganized  Thought Content: Persistent paranoid delusions regarding staff poisoning or attempting to overdose her; no evidence of internal preoccupation  SI/HI: Denies  Perceptions: Denies  Judgement: Lacking  Insight: Lacking  Fund of Knowledge: WNL   Lab Results:  Admission on 08/12/2024  Component Date Value Ref Range Status   Cholesterol 08/16/2024 172  0 - 200 mg/dL Final   Triglycerides 89/95/7974 108  <150 mg/dL Final   HDL 89/95/7974 54  >40 mg/dL Final   Total CHOL/HDL Ratio 08/16/2024 3.2  RATIO Final   VLDL 08/16/2024 22  0 - 40 mg/dL Final   LDL Cholesterol 08/16/2024 96  0 - 99 mg/dL Final   Hgb J8r MFr Bld 08/16/2024 4.9  4.8 - 5.6 % Final   Mean Plasma Glucose 08/16/2024 93.93  mg/dL Final   RPR Ser Ql 89/95/7974 Reactive (A)  NON REACTIVE Final   RPR Titer 08/16/2024 1:2   Final  T Pallidum Abs 08/16/2024 Reactive (A)  Non Reactive Final     Vitals: Blood pressure 106/70, pulse (!) 104, temperature 98 F (36.7 C),  temperature source Oral, resp. rate 14, height 5' 1 (1.549 m), weight 59 kg, SpO2 100%.   NB: This note was created using a voice recognition software as a result there may be grammatical errors inadvertently enclosed that do not reflect the nature of this encounter. Every attempt is made to correct such errors.   Alan Maiden, MD PGY-1, Psychiatry Residency  08/25/2024, 3:10 PM

## 2024-08-25 NOTE — Group Note (Signed)
 LCSW Group Therapy Note   Group Date: 08/25/2024 Start Time: 1300 End Time: 1400   Participation:  did not attend  Type of Therapy:  Group Therapy  Topic:  Healing Flames: Navigating Anger with Compassion  Objective:  Foster self-awareness and promote compassion toward oneself and others when dealing with anger.  Goals:  Help participants understand the underlying emotions and needs fueling anger. Provide coping strategies for healthier emotional expression and anger management.  Summary: This session explored anger as a volcano--an explosion driven by deeper feelings and unmet needs. Participants learned to identify anger triggers and underlying emotions, then practiced coping strategies like deep breathing, physical activity, and journaling. The group discussed healthy ways to manage anger before it escalates, using both personal reflection and shared experiences.  Therapeutic Modalities: Cognitive Behavioral Therapy (CBT): Challenging thoughts that fuel anger. Mindfulness: Increasing awareness of emotions and sensations.   Sullivan Jacuinde O Christina Gintz, LCSWA 08/25/2024  3:03 PM

## 2024-08-25 NOTE — Plan of Care (Signed)
   Problem: Education: Goal: Knowledge of [facility] Education information/materials will improve Outcome: Progressing Goal: Emotional status will improve Outcome: Progressing Goal: Mental status will improve Outcome: Progressing Goal: Verbalization of understanding the information provided will improve Outcome: Progressing

## 2024-08-25 NOTE — Group Note (Signed)
 Date:  08/25/2024 Time:  8:19 PM  Group Topic/Focus:  Wrap-Up Group:   The focus of this group is to help patients review their daily goal of treatment and discuss progress on daily workbooks.    Participation Level:  Did Not Attend  Participation Quality:  n/a  Affect:  n/a  Cognitive:  n/a  Insight: None  Engagement in Group:  n/a  Modes of Intervention:  n/a  Additional Comments:  Patient was encouraged but did not attend.  Eward Mace 08/25/2024, 8:19 PM

## 2024-08-25 NOTE — BH Assessment (Signed)
(  Sleep Hours) - 9.75 (Any PRNs that were needed, meds refused, or side effects to meds)-  (Any disturbances and when (visitation, over night)- None (Concerns raised by the patient)- Pt continues to ask for meds R/T anxiety (SI/HI/AVH)- Denies

## 2024-08-25 NOTE — Group Note (Signed)
 Recreation Therapy Group Note   Group Topic:Self-Esteem  Group Date: 08/25/2024 Start Time: 1000 End Time: 1030 Facilitators: Vesta Wheeland-McCall, LRT,CTRS Location: [address] Dayroom   Group Topic: Self Esteem    Goal Area(s) Addresses:  Patient will appropriately identify what self esteem is.  Patient will create a shield of armor describing themselves.  Patient will successfully identify positive attributes about themselves.  Patient will acknowledge benefit of improved self-esteem.   Behavioral Response:   Intervention / Activity: Self-Esteem Shield. Patient attended a recreation therapy group session focused on self esteem. Patient identified what self esteem is, and why it is important to have high self esteem during group discussion. LRT wrote on the white board, drawing the outline of the shield and labeling the quadrants.  Patient was asked to create their own shield to show off their unique attributes, four quadrants reflected the following:  The Upper Left quadrant- reasons they are unique/special. The Upper Right quadrant- things that they love to do. The Lower Left quadrant- goals for their future. The Lower Right quadrant- character words that describe them.    Patients were provided sheets with the shield printed on them and colored pencils, markers and crayons to complete the activity.  Patients and writer had group related discussions while individually working on their activity.  Patients were debriefed on the importance of healthy self esteem and offered a handout for ways to increase self esteem.   Education: Self esteem, Communication, Positive self-talk, Discharge Planning   Education Outcome: Acknowledges education/Verbalizes understanding of Education/In group clarification offered/Needs further education    Clinical Observations/Individualized Feedback: Recreation therapy group was to occur at this time. Due to lack of staffing, LRT was assisting with  the dayroom on [address].     Plan: Continue to engage patient in RT group sessions 2-3x/week.   Mikie Misner-McCall, LRT,CTRS  08/25/2024 12:06 PM

## 2024-08-25 NOTE — Plan of Care (Signed)
  Problem: Activity: Goal: Interest or engagement in activities will improve Outcome: Progressing Goal: Sleeping patterns will improve Outcome: Progressing   Problem: Safety: Goal: Periods of time without injury will increase Outcome: Progressing

## 2024-08-25 NOTE — Progress Notes (Signed)
(  Sleep Hours) -17.75  (Any PRNs that were needed, meds refused, or side effects to meds)- Vistaril 25 mg,   (Any disturbances and when (visitation, over night)- none  (Concerns raised by the patient)- denies  (SI/HI/AVH)-none

## 2024-08-26 MED ORDER — OLANZAPINE 5 MG PO TABS
5.0000 mg | ORAL_TABLET | Freq: Every day | ORAL | Status: DC
Start: 2024-08-26 — End: 2024-08-27
  Filled 2024-08-26: qty 1

## 2024-08-26 NOTE — Progress Notes (Signed)
(  Sleep Hours) -  (Any PRNs that were needed, meds refused, or side effects to meds)- none  (Any disturbances and when (visitation, over night)-none  (Concerns raised by the patient)- pt too lethargic at this time , HS medications held  (SI/HI/AVH)-denies

## 2024-08-26 NOTE — Progress Notes (Signed)
 Tour of Duty:  Prentice JINNY Angle, RN, 08/26/24, Tour of Duty: 0700-1900  SI/HI/AVH: Denies  Self-Reported   Mood: Positive  Anxiety: Denies, but Observable Depression: Denies Irritability: Denies  Broset  Violence Prevention Guidelines *See Row Information*: Moderate Violence Risk interventions implemented   LBM  Last BM Date : 08/26/24   Pain: present, PRN provided (see MAR)  Patient Refusals (including Rx): No  >>Shift Summary: Patient observed to be anxious on unit, she tends to isolate to room. Patient able to make needs known. Patient observed to engage appropriately with staff and peers. Patient speech has improved and is notably less slurred. Patient taking medications as prescribed. This shift, PRN medication requested or required. No reported or observed side effects to medication. No reported or observed agitation, aggression, or other acute emotional distress. No reported or observed physical abnormalities or concerns.  Last Vitals  Vitals Weight: 59 kg Temp: 98 F (36.7 C) Temp Source: Oral Pulse Rate: 90 Resp: 16 BP: 118/81 Patient Position: (not recorded)  Admission Type  Psych Admission Type (Psych Patients Only) Admission Status: Involuntary Date 72 hour document signed : (not recorded) Time 72 hour document signed : (not recorded) Provider Notified (First and Last Name) (see details for LINK to note): (not recorded)   Psychosocial Assessment  Psychosocial Assessment Patient Complaints: None Eye Contact: Avertive Facial Expression: Anxious Affect: Preoccupied Speech: Soft Interaction: Childlike, Cautious Motor Activity: Rigidity Appearance/Hygiene: Unremarkable Behavior Characteristics: Anxious Mood: Anxious, Preoccupied   Aggressive Behavior  Targets: (not recorded)   Thought Process  Thought Process Coherency: Disorganized Content: Preoccupation Delusions: None reported or observed Perception: Within Defined Limits Hallucination:  None reported or observed Judgment: Poor Confusion: Mild  Danger to Self/Others  Danger to Self Current suicidal ideation?: Denies Description of Suicide Plan: (not recorded) Self-Injurious Behavior: (not recorded) Agreement Not to Harm Self: (not recorded) Description of Agreement: (not recorded) Danger to Others: None reported or observed

## 2024-08-26 NOTE — Progress Notes (Signed)
 [facility] Inpatient Psychiatry Progress Note  Date: 08/26/2024 Patient: Nancy Howell MRN: [id number]  Chastin Garlitz is a 48 y.o. F admitted for suicidal ideation.  Assessment and Plan:  Rhyleigh Grassel is a 48 year old female with schizoaffective disorder, bipolar type, who was previously exhibiting worsening mania and psychosis. The patient has shown notable improvement in agitation and behavioral control on Thorazine, as she is now verbally redirectable and no longer aggressive or threatening toward staff.  The patient's stiffness in jaw and heaviness of tongue has reduced since discontinuation of thorazine. She continues to be calm, cooperative, with fixed delusions without the agitation and aggression. We will plan to start olanzapine 5 mg at bedtime at request of patient. Patient is being screened later today for possible admission to the [place] substance use program in [place].   # Schizoaffective disorder, cocaine use disorder - Discontnue thorazine 50mg  tablets twice daily due to possible EPS, discontinue linked FMP order - Start Olanzapine 5 mg at bedtime for psychosis  -Continue Depakote 500 mg twice daily for seizure disorder, patient states she had a tubal ligation and uses condoms -Continue Cogentin 2 mg twice daily -10/11: Per second opinion of Dr. Raliegh, forced medications of Thorazine tablets twice daily by mouth or Thorazine 50 mg IM plus Benadryl 50 mg IM if patient does not take Thorazine tablets by mouth  # Nicotine Withdrawal - Patient in need of nicotine replacement; nicotine polacrilex (gum) ordered. Smoking cessation encouraged   Risk Assessment: Patient continues to be gravely disabled and unable to care for self due to severe and impairing psychosis.  Discharge Planning: Barriers to Discharge: Medication management Estimated Length of Stay: 7-10 days Predicted Discharge Location: Possible substance use program  Interval History: Chart reviewed. No  significant events overnight, patient slept 17.5 hours (including naps). PRNs in the last 24 hours include Benadryl for continued tongue heaviness and trazodone for sleep.   Patient found lying in bed. She states she is feeling less stiff this past day, but cannot walk due to being a high fall risk.  She is she feels better since discontinuing Thorazine, and says she will get a lot better just on her Invega and Depakote.  After being consulted the Depakote most likely will not help with the residual delusions and paranoia, patient asked for olanzapine.  Patient denies SI, HI, and AVH.  Patient still presents with delusions about being overdosed and paranoia about the staff but with significantly less aggression than before.  Patient is currently working on getting into a substance use program in [place] and is completing a phone screening later on today for possible admission.  Physical Examination:  Vitals and nursing note reviewed  Musculoskeletal: Normal gait and station Strength & Muscle Tone: within normal limits Gait & Station: normal  Mental Status Exam:  Appearance: Appropriate for environment  Behavior: Calm, cooperative during assessment, no psychomotor agitation or retardation observed  Attitude: Guarded but less hostile compared to prior encounters  Speech: Decreased volume, normal tone and rate.   Mood: I'm feeling better  Affect: Restricted range, congruent with stated mood.  Thought Process: Disorganized  Thought Content: Persistent paranoid delusions regarding staff poisoning or attempting to overdose her; no evidence of internal preoccupation  SI/HI: Denies  Perceptions: Denies  Judgement: Lacking  Insight: Lacking  Fund of Knowledge: WNL   Lab Results:  Admission on 08/12/2024  Component Date Value Ref Range Status   Cholesterol 08/16/2024 172  0 - 200 mg/dL Final   Triglycerides 89/95/7974  108  <150 mg/dL Final   HDL 89/95/7974 54  >40 mg/dL Final   Total  CHOL/HDL Ratio 08/16/2024 3.2  RATIO Final   VLDL 08/16/2024 22  0 - 40 mg/dL Final   LDL Cholesterol 08/16/2024 96  0 - 99 mg/dL Final   Hgb J8r MFr Bld 08/16/2024 4.9  4.8 - 5.6 % Final   Mean Plasma Glucose 08/16/2024 93.93  mg/dL Final   RPR Ser Ql 89/95/7974 Reactive (A)  NON REACTIVE Final   RPR Titer 08/16/2024 1:2   Final   T Pallidum Abs 08/16/2024 Reactive (A)  Non Reactive Final     Vitals: Blood pressure (!) 125/90, pulse 96, temperature 98 F (36.7 C), temperature source Oral, resp. rate 16, height 5' 1 (1.549 m), weight 59 kg, SpO2 100%.   NB: This note was created using a voice recognition software as a result there may be grammatical errors inadvertently enclosed that do not reflect the nature of this encounter. Every attempt is made to correct such errors.   Alan Maiden, MD PGY-1, Psychiatry Residency  08/26/2024, 11:54 AM

## 2024-08-26 NOTE — Group Note (Addendum)
 Recreation Therapy Group Note   Group Topic:Healthy Decision Making  Group Date: 08/26/2024 Start Time: 1030 End Time: 1100 Facilitators: Zella Dewan-McCall, LRT,CTRS Location: [address] Dayroom   Group Topic: Decision Making, Problem Solving, Communication   Goal Area(s) Addresses:  Patient will effectively work with peer towards shared goal.  Patient will identify factors that guided their decision making.  Patient will pro-socially communicate ideas during group session.    Behavioral Response:    Intervention: Survival Scenario - pencil, paper   Activity: Patients were given a scenario that they were going to be stranded on a deserted [place] for several months before being rescued. Writer tasked them with making a list of 15 things they would choose to bring with them for survival. The list of items was prioritized most important to least. Each patient would come up with their own list, then work together to create a new list of 15 items while in a group of 3-5 peers. LRT discussed each person's list and how it differed from others. The debrief included discussion of priorities, good decisions versus bad decisions, and how it is important to think before acting so we can make the best decision possible. LRT tied the concept of effective communication among group members to patient's support systems outside of the hospital and its benefit post discharge.   Education: Pharmacist, community, Priorities, Support System, Discharge Planning    Education Outcome: Acknowledges education/In group clarification/Needs additional education    Affect/Mood: N/A   Participation Level: Did not attend    Clinical Observations/Individualized Feedback:     Plan: Continue to engage patient in RT group sessions 2-3x/week.   Nancy Howell, LRT,CTRS 08/26/2024 12:46 PM

## 2024-08-26 NOTE — [facility] Counselor (Signed)
 CSW spoke with patient regarding [facility] for substance use in [place]. Patient reported she'd be interested in completing the phone screening. CSW to meet with patient for phone screening after progression meeting today.  Jacquelyn Shadrick, LCSWA 08/26/24 1:54PM

## 2024-08-26 NOTE — Group Note (Signed)
 Date:  08/26/2024 Time:  10:15 AM  Group Topic/Focus:  Goals Group:   The focus of this group is to help patients establish daily goals to achieve during treatment and discuss how the patient can incorporate goal setting into their daily lives to aide in recovery. Orientation:   The focus of this group is to educate the patient on the purpose and policies of crisis stabilization and provide a format to answer questions about their admission.  The group details unit policies and expectations of patients while admitted.    Participation Level:  Did Not Attend    Nancy Howell 08/26/2024, 10:15 AM

## 2024-08-26 NOTE — Group Note (Signed)
 Date:  08/26/2024 Time:  8:26 PM  Group Topic/Focus:  Wrap-Up Group:   The focus of this group is to help patients review their daily goal of treatment and discuss progress on daily workbooks.    Participation Level:  Did Not Attend   Nancy Howell 08/26/2024, 8:26 PM

## 2024-08-26 NOTE — Group Note (Signed)
 Date:  08/26/2024 Time:  6:38 PM  Group Topic/Focus:  Self Care:   The focus of this group is to help patients understand the importance of self-care in order to improve or restore emotional, physical, spiritual, interpersonal, and financial health.    Participation Level:  Did Not Attend  Participation Quality:  na  Affect:  na  Cognitive:  na  Insight: na  Engagement in Group:  na  Modes of Intervention:  na  Additional Comments:  Patient did not attend group.  Nancy Howell [name] 08/26/2024, 6:38 PM

## 2024-08-26 NOTE — Plan of Care (Signed)
°  Problem: Education: °Goal: Emotional status will improve °Outcome: Progressing °  °Problem: Activity: °Goal: Sleeping patterns will improve °Outcome: Progressing °  °Problem: Safety: °Goal: Periods of time without injury will increase °Outcome: Progressing °  °

## 2024-08-26 NOTE — Plan of Care (Signed)
   Problem: Education: Goal: Emotional status will improve Outcome: Progressing Goal: Mental status will improve Outcome: Progressing

## 2024-08-27 ENCOUNTER — Encounter (HOSPITAL_COMMUNITY): Payer: Self-pay

## 2024-08-27 MED ORDER — OLANZAPINE 10 MG PO TABS
10.0000 mg | ORAL_TABLET | Freq: Every day | ORAL | Status: DC
  Administered 2024-08-27 – 2024-08-28 (×2): 10 mg via ORAL
  Filled 2024-08-27 (×2): qty 1

## 2024-08-27 NOTE — Progress Notes (Signed)
 [facility] Inpatient Psychiatry Progress Note  Date: 08/27/2024 Patient: Nancy Howell MRN: [id number]  Dickie Labarre is a 48 y.o. F admitted for suicidal ideation.  Assessment and Plan:  Jarika Robben is a 48 year old female with schizoaffective disorder, bipolar type, who was previously exhibiting worsening mania and psychosis. The patient has shown notable improvement in agitation and behavioral control as she is now verbally redirectable and no longer aggressive or threatening toward staff.  The patient's stiffness in jaw and heaviness of tongue has reduced since discontinuation of thorazine. She continues to be calm, cooperative and no longer presenting with delusions about her medications.  Patient was consulted about her nightly olanzapine being held due to her drowsiness.  We will plan for discharge soon once we hear from the [facility] substance use program about their decision.   # Schizoaffective disorder, cocaine use disorder - Start Olanzapine 10 mg at bedtime for psychosis  -Continue Depakote 500 mg twice daily for seizure disorder, patient states she had a tubal ligation and uses condoms -Continue Cogentin 2 mg twice daily -10/11: Per second opinion of Dr. Raliegh, forced medications of Thorazine tablets twice daily by mouth or Thorazine 50 mg IM plus Benadryl 50 mg IM if patient does not take Thorazine tablets by mouth  # Nicotine Withdrawal - Patient in need of nicotine replacement; nicotine polacrilex (gum) ordered. Smoking cessation encouraged   Risk Assessment: Patient continues to be gravely disabled and unable to care for self due to severe and impairing psychosis.  Discharge Planning: Barriers to Discharge: Medication management Estimated Length of Stay: 15-20 days Predicted Discharge Location: Possible substance use program  Interval History: Chart reviewed.  Per nursing report, patient was too lethargic overnight for nightly olanzapine.  Patient slept 11.5  hours  Patient found lying in bed.  Patient states she continues to feel less stiff since the discontinuation of Thorazine.  Patient's tongue no longer feels heavy, and she continues to ruminate on her Depakote.  Patient was consulted about her olanzapine and how it was held due to her drowsiness.  Patient continues to deny SI, HI, and AVH and is wondering about discharge.  Patient no longer presents with delusions about being overdosed or paranoia, which is a significant improvement from the past few days.  Patient states her interview with the substance use program in [place] went well, but states that she wants to discharge to her sister's house.  Patient also endorses yellow vaginal discharge with a strong odor, though patient told nurse vaginal discharge was brown and could not specify on the odor.  Patient asked for Flagyl and states she has had vaginal infections in the past and Flagyl worked.  Physical Examination:  Vitals and nursing note reviewed  Musculoskeletal: Normal gait and station  Mental Status Exam:  Appearance: Appropriate for environment  Behavior: Calm, cooperative during assessment, no psychomotor agitation or retardation observed  Attitude: More open  Speech: Decreased volume, normal tone and rate.  Childlike tone  Mood: I'm feeling better  Affect: Congruent with stated mood.  Thought Process: Disorganized  Thought Content: No delusions noted  SI/HI: Denies  Perceptions: Denies  Judgement: Lacking  Insight: Lacking  Fund of Knowledge: WNL   Lab Results:  Admission on 08/12/2024  Component Date Value Ref Range Status   Cholesterol 08/16/2024 172  0 - 200 mg/dL Final   Triglycerides 89/95/7974 108  <150 mg/dL Final   HDL 89/95/7974 54  >40 mg/dL Final   Total CHOL/HDL Ratio 08/16/2024 3.2  RATIO Final   VLDL 08/16/2024 22  0 - 40 mg/dL Final   LDL Cholesterol 08/16/2024 96  0 - 99 mg/dL Final   Hgb J8r MFr Bld 08/16/2024 4.9  4.8 - 5.6 % Final   Mean  Plasma Glucose 08/16/2024 93.93  mg/dL Final   RPR Ser Ql 89/95/7974 Reactive (A)  NON REACTIVE Final   RPR Titer 08/16/2024 1:2   Final   T Pallidum Abs 08/16/2024 Reactive (A)  Non Reactive Final     Vitals: Blood pressure 109/82, pulse 94, temperature (!) 97.5 F (36.4 C), temperature source Oral, resp. rate 16, height 5' 1 (1.549 m), weight 59 kg, SpO2 100%.   NB: This note was created using a voice recognition software as a result there may be grammatical errors inadvertently enclosed that do not reflect the nature of this encounter. Every attempt is made to correct such errors.   Alan Maiden, MD PGY-1, Psychiatry Residency  08/27/2024, 2:54 PM

## 2024-08-27 NOTE — Progress Notes (Signed)
   08/27/24 1200  Psych Admission Type (Psych Patients Only)  Admission Status Involuntary  Psychosocial Assessment  Patient Complaints Isolation  Eye Contact Brief  Facial Expression Flat  Affect Preoccupied  Speech Soft  Interaction Childlike  Motor Activity Slow  Appearance/Hygiene Unremarkable  Behavior Characteristics Cooperative  Mood Preoccupied  Thought Process  Coherency Circumstantial  Content Preoccupation  Delusions None reported or observed  Perception WDL  Hallucination None reported or observed  Judgment Poor  Confusion None  Danger to Self  Current suicidal ideation? Denies  Danger to Others  Danger to Others None reported or observed

## 2024-08-27 NOTE — BH IP Treatment Plan (Signed)
 Interdisciplinary Treatment and Diagnostic Plan Update  08/27/2024 Time of Session: 9:40AM - UPDATE Nancy Howell MRN: [id number]  Principal Diagnosis: Severe stimulant use disorder (HCC)  Secondary Diagnoses: Principal Problem:   Severe stimulant use disorder (HCC) Active Problems:   GAD (generalized anxiety disorder)   Schizoaffective disorder (HCC)   Current Medications:  Current Facility-Administered Medications  Medication Dose Route Frequency Provider Last Rate Last Admin   acetaminophen (TYLENOL) tablet 1,000 mg  1,000 mg Oral Q6H PRN McCarty, Artie, MD   1,000 mg at 08/26/24 1513   alum & mag hydroxide-simeth (MAALOX/MYLANTA) 200-200-20 MG/5ML suspension 30 mL  30 mL Oral Q4H PRN Motley-Mangrum, Jadeka A, PMHNP       benztropine (COGENTIN) tablet 2 mg  2 mg Oral BID Marry Clamp, MD   2 mg at 08/27/24 9387   haloperidol  (HALDOL ) tablet 5 mg  5 mg Oral TID PRN Motley-Mangrum, Jadeka A, PMHNP   5 mg at 08/19/24 1546   And   diphenhydrAMINE (BENADRYL) capsule 50 mg  50 mg Oral TID PRN Motley-Mangrum, Jadeka A, PMHNP   50 mg at 08/19/24 1546   haloperidol  lactate (HALDOL ) injection 5 mg  5 mg Intramuscular TID PRN Motley-Mangrum, Jadeka A, PMHNP       And   diphenhydrAMINE (BENADRYL) injection 50 mg  50 mg Intramuscular TID PRN Motley-Mangrum, Jadeka A, PMHNP       And   LORazepam (ATIVAN) injection 2 mg  2 mg Intramuscular TID PRN Motley-Mangrum, Jadeka A, PMHNP       haloperidol  lactate (HALDOL ) injection 10 mg  10 mg Intramuscular TID PRN Motley-Mangrum, Jadeka A, PMHNP       And   diphenhydrAMINE (BENADRYL) injection 50 mg  50 mg Intramuscular TID PRN Motley-Mangrum, Jadeka A, PMHNP       And   LORazepam (ATIVAN) injection 2 mg  2 mg Intramuscular TID PRN Motley-Mangrum, Jadeka A, PMHNP       divalproex (DEPAKOTE) DR tablet 500 mg  500 mg Oral Q12H Elodie Palma, MD   500 mg at 08/27/24 0829   feeding supplement (ENSURE PLUS HIGH PROTEIN) liquid 237 mL  237 mL Oral BID BM  Marry Clamp, MD   237 mL at 08/26/24 1455   fluticasone (FLONASE) 50 MCG/ACT nasal spray 1 spray  1 spray Each Nare Q12H PRN Towana Leita SAILOR, MD   1 spray at 08/22/24 2029   fluticasone (FLONASE) 50 MCG/ACT nasal spray 2 spray  2 spray Each Nare Daily McCarty, Artie, MD   2 spray at 08/27/24 0828   hydrOXYzine (ATARAX) tablet 25 mg  25 mg Oral TID PRN Motley-Mangrum, Jadeka A, PMHNP   25 mg at 08/25/24 2041   ibuprofen (ADVIL) tablet 600 mg  600 mg Oral Q6H PRN McCarty, Artie, MD   600 mg at 08/26/24 0621   lidocaine (LIDODERM) 5 % 1 patch  1 patch Transdermal Daily Mannie Ashley SAILOR, MD   1 patch at 08/27/24 0829   magnesium hydroxide (MILK OF MAGNESIA) suspension 30 mL  30 mL Oral Daily PRN Motley-Mangrum, Jadeka A, PMHNP   30 mL at 08/13/24 2039   nicotine polacrilex (NICORETTE) gum 2 mg  2 mg Oral PRN Motley-Mangrum, Jadeka A, PMHNP   2 mg at 08/27/24 1215   OLANZapine (ZYPREXA) tablet 10 mg  10 mg Oral QHS Elodie Palma, MD       sodium chloride (OCEAN) 0.65 % nasal spray 1 spray  1 spray Each Nare PRN Towana Leita SAILOR, MD  traZODone (DESYREL) tablet 50 mg  50 mg Oral QHS PRN Bobbitt, Shalon E, NP   50 mg at 08/25/24 2041   PTA Medications: Medications Prior to Admission  Medication Sig Dispense Refill Last Dose/Taking   benztropine (COGENTIN) 2 MG tablet Take 2 mg by mouth 2 (two) times daily.   08/13/2024 Morning   diphenhydrAMINE (BENADRYL) 25 mg capsule Take 25 mg by mouth daily.   08/12/2024   Paliperidone Palmitate (INVEGA SUSTENNA IM) Inject into the muscle. Pt received at [facility] [phone] pharmcacy refused to provide dosing and administration information   Taking    Patient Stressors: Financial difficulties   Loss of father (He died a year ago)   Substance abuse    Patient Strengths: Capable of independent living  Communication skills  Supportive family/friends   Treatment Modalities: Medication Management, Group therapy, Case management,  1 to 1  session with clinician, Psychoeducation, Recreational therapy.   Physician Treatment Plan for Primary Diagnosis: Severe stimulant use disorder (HCC) Long Term Goal(s):     Short Term Goals:    Medication Management: Evaluate patient's response, side effects, and tolerance of medication regimen.  Therapeutic Interventions: 1 to 1 sessions, Unit Group sessions and Medication administration.  Evaluation of Outcomes: Progressing  Physician Treatment Plan for Secondary Diagnosis: Principal Problem:   Severe stimulant use disorder (HCC) Active Problems:   GAD (generalized anxiety disorder)   Schizoaffective disorder (HCC)  Long Term Goal(s):     Short Term Goals:       Medication Management: Evaluate patient's response, side effects, and tolerance of medication regimen.  Therapeutic Interventions: 1 to 1 sessions, Unit Group sessions and Medication administration.  Evaluation of Outcomes: Progressing   RN Treatment Plan for Primary Diagnosis: Severe stimulant use disorder (HCC) Long Term Goal(s): Knowledge of disease and therapeutic regimen to maintain health will improve  Short Term Goals: Ability to remain free from injury will improve, Ability to verbalize frustration and anger appropriately will improve, Ability to demonstrate self-control, Ability to participate in decision making will improve, and Ability to verbalize feelings will improve  Medication Management: RN will administer medications as ordered by provider, will assess and evaluate patient's response and provide education to patient for prescribed medication. RN will report any adverse and/or side effects to prescribing provider.  Therapeutic Interventions: 1 on 1 counseling sessions, Psychoeducation, Medication administration, Evaluate responses to treatment, Monitor vital signs and CBGs as ordered, Perform/monitor CIWA, COWS, AIMS and Fall Risk screenings as ordered, Perform wound care treatments as  ordered.  Evaluation of Outcomes: Progressing   LCSW Treatment Plan for Primary Diagnosis: Severe stimulant use disorder (HCC) Long Term Goal(s): Safe transition to appropriate next level of care at discharge, Engage patient in therapeutic group addressing interpersonal concerns.  Short Term Goals: Engage patient in aftercare planning with referrals and resources, Increase social support, Increase ability to appropriately verbalize feelings, Increase emotional regulation, and Facilitate acceptance of mental health diagnosis and concerns  Therapeutic Interventions: Assess for all discharge needs, 1 to 1 time with Social worker, Explore available resources and support systems, Assess for adequacy in community support network, Educate family and significant other(s) on suicide prevention, Complete Psychosocial Assessment, Interpersonal group therapy.  Evaluation of Outcomes: Progressing   Progress in Treatment: Attending groups: attended some groups Participating in groups: Yes Taking medication as prescribed:  Yes Toleration medication:  Yes Family/Significant other contact made: No, will contact:  Sister, [name] [phone].  First attempt: 08/13/24 @ 350PM.  Second attempt:  second attempt:  08/14/24 @ 150PM.   Patient understands diagnosis: Yes. Discussing patient identified problems/goals with staff: Yes. Medical problems stabilized or resolved: Yes. Denies suicidal/homicidal ideation: Yes. Issues/concerns per patient self-inventory: No.   New problem(s) identified: No, Describe:  none reported    New Short Term/Long Term Goal(s): detox, medication management for mood stabilization; elimination of SI thoughts; development of comprehensive mental wellness/sobriety plan   Patient Goals:  Figure out my SSI and possibly go to inpatient rehab   Discharge Plan or Barriers: Patient recently admitted. CSW will continue to follow and assess for appropriate referrals and possible  discharge planning.      Reason for Continuation of Hospitalization: Anxiety Medication stabilization Suicidal ideation   Estimated Length of Stay: 3 - 4 days    Last 3 [place] Suicide Severity Risk Score: Flowsheet Row Ad[facility] (Current[facility] Most recent reading at 08/12/2024  9:00 PM ED from 08/12/2024 in [facility] Emergency Department at [facility] Most recent reading at 08/12/2024  9:54 AM  C-SSRS RISK CATEGORY Low Risk Low Risk    Last PHQ 2/9 Scores:     No data to display          Scribe for Treatment Team: Zackariah Vanderpol M Keilin Gamboa, ISRAEL 08/27/2024 1:28 PM

## 2024-08-27 NOTE — Group Note (Signed)
 Recreation Therapy Group Note   Group Topic:Problem Solving  Group Date: 08/27/2024 Start Time: 1020 End Time: 1040 Facilitators: Makylee Sanborn-McCall, LRT,CTRS Location: 500 Hall Dayroom   Group Topic: Communication, Team Building, Problem Solving   Goal Area(s) Addresses:  Patient will effectively work with peer towards shared goal.  Patient will identify skills used to make activity successful.  Patient will identify how skills used during activity can be used to reach post d/c goals.    Behavioral Response:    Intervention: STEM Activity   Activity: Landing Pad. In teams of 3-5, patients were given 12 plastic drinking straws and an equal length of masking tape. Using the materials provided, patients were asked to build a landing pad to catch a golf ball dropped from approximately 5 feet in the air. All materials were required to be used by the team in their design. LRT facilitated post-activity discussion.   Education: Pharmacist, community, Scientist, physiological, Discharge Planning    Education Outcome: Acknowledges education/In group clarification offered/Needs additional education.    Clinical Observations/Individualized Feedback: Due to acuity on the unit, recreation therapy group was not held this morning.     Plan: Continue to engage patient in RT group sessions 2-3x/week.   Coulton Schlink-McCall, LRT,CTRS 08/27/2024 12:14 PM

## 2024-08-27 NOTE — Plan of Care (Signed)
   Problem: Education: Goal: Emotional status will improve Outcome: Not Progressing Goal: Mental status will improve Outcome: Not Progressing

## 2024-08-27 NOTE — Group Note (Signed)
 Date:  08/27/2024 Time:  8:27 PM  Group Topic/Focus:  Wrap-Up Group:   The focus of this group is to help patients review their daily goal of treatment and discuss progress on daily workbooks.    Participation Level:  Did Not Attend   [name] 08/27/2024, 8:27 PM

## 2024-08-28 NOTE — [facility] Suicide Risk Assessment (Signed)
 [facility] INPATIENT:  Family/Significant Other Suicide Prevention Education  Suicide Prevention Education:  Contact Attempts:  Sister, [name] [phone] , (name of family member/significant other) has been identified by the patient as the family member/significant other with whom the patient will be residing, and identified as the person(s) who will aid the patient in the event of a mental health crisis.  With written consent from the patient, two attempts were made to provide suicide prevention education, prior to and/or following the patient's discharge.  We were unsuccessful in providing suicide prevention education.  A suicide education pamphlet was given to the patient to share with family/significant other.  Date and time of first attempt:08-19-24/3:45PM Date and time of second attempt:08-28-24/4:27PM  Nancy Howell 08/28/2024, 4:25 PM

## 2024-08-28 NOTE — Hospital Course (Signed)
 During the patient's hospitalization, patient had extensive initial psychiatric evaluation, and follow-up psychiatric evaluations every day.  Psychiatric diagnoses provided upon initial assessment: Stimulant Use Disorder, severe dependence (cocaine)  Patient's psychiatric medications were adjusted on admission: Start Invega 3 mg while waiting last date of Invega Trinza injection and start Cogentin 2 mg  During the hospitalization, other adjustments were made to the patient's psychiatric medication regimen: After multiple trials, patient is now on olanzapine 10 mg at night, home dose of Depakote 500 mg twice daily, continued cogentin, and patient is due for next dose of Invega Trinza in December. Patient was thoroughly consulted on 2 forms of birth control while on Depakote. Patient's tubes are tied and she uses condoms.  Patient's care was discussed during the interdisciplinary team meeting every day during the hospitalization.  The patient experienced EPS while on thorazine, which was discontinued at the first incident of EPS, she denied having side effects to current prescribed psychiatric medication.  Gradually, patient started adjusting to milieu. The patient was evaluated each day by a clinical provider to ascertain response to treatment. Improvement was noted by the patient's report of decreasing symptoms, improved sleep and appetite, affect, medication tolerance, behavior, and participation in unit programming.  Patient was asked each day to complete a self inventory noting mood, mental status, pain, new symptoms, anxiety and concerns.   Symptoms were reported as significantly decreased or resolved completely by discharge.  The patient reports that their mood is stable.  The patient denied having suicidal thoughts for more than 48 hours prior to discharge.  Patient denies having homicidal thoughts.  Patient denies having auditory hallucinations.  Patient denies any visual hallucinations or  other symptoms of psychosis.  The patient was motivated to continue taking medication with a goal of continued improvement in mental health.   The patient reports their target psychiatric symptoms of psychosis responded well to the psychiatric medications, and the patient reports overall benefit other psychiatric hospitalization. Supportive psychotherapy was provided to the patient. The patient also participated in regular group therapy while hospitalized. Coping skills, problem solving as well as relaxation therapies were also part of the unit programming.  Labs were reviewed with the patient, and abnormal results were discussed with the patient.  The patient is able to verbalize their individual safety plan to this provider.  # It is recommended to the patient to continue psychiatric medications as prescribed, after discharge from the hospital.    # It is recommended to the patient to follow up with your outpatient psychiatric provider and PCP.  # It was discussed with the patient, the impact of alcohol, drugs, tobacco have been there overall psychiatric and medical wellbeing, and total abstinence from substance use was recommended the patient.ed.  # Prescriptions provided or sent directly to preferred pharmacy at discharge. Patient agreeable to plan. Given opportunity to ask questions. Appears to feel comfortable with discharge.    # In the event of worsening symptoms, the patient is instructed to call the crisis hotline, 911 and or go to the nearest ED for appropriate evaluation and treatment of symptoms. To follow-up with primary care provider for other medical issues, concerns and or health care needs  # Patient was discharged [facility] program with a plan to follow up as noted below.    On day of discharge ***

## 2024-08-28 NOTE — Progress Notes (Signed)
 CSW faxed requested documentation to [facility]. After searching [employer], patient never had a misdemeanor assault charge as pt reported in interview. CSW to follow up with [facility] on 08/29/24 regarding admission status.  Crystale Giannattasio, LCSWA 08/28/24 3:20PM

## 2024-08-28 NOTE — Progress Notes (Signed)
(  Sleep Hours) -7.25  (Any PRNs that were needed, meds refused, or side effects to meds)- Nicotine gum  (Any disturbances and when (visitation, over night)- none  (Concerns raised by the patient)- none  (SI/HI/AVH)- denies

## 2024-08-28 NOTE — Group Note (Signed)
 Date:  08/28/2024 Time:  8:35 PM  Group Topic/Focus:  Wrap-Up Group:   The focus of this group is to help patients review their daily goal of treatment and discuss progress on daily workbooks.    Participation Level:  Did Not Attend   Kari Kerth Dacosta 08/28/2024, 8:35 PM

## 2024-08-28 NOTE — Group Note (Signed)
 Occupational Therapy Group Note  Group Topic:Coping Skills  Group Date: 08/28/2024 Start Time: 1535 End Time: 1603 Facilitators: Dot [place] MATSU, OT   Group Description: Group encouraged increased engagement and participation through discussion and activity focused on Coping Ahead. Patients were split up into teams and selected a card from a stack of positive coping strategies. Patients were instructed to act out/charade the coping skill for other peers to guess and receive points for their team. Discussion followed with a focus on identifying additional positive coping strategies and patients shared how they were going to cope ahead over the weekend while continuing hospitalization stay.  Therapeutic Goal(s): Identify positive vs negative coping strategies. Identify coping skills to be used during hospitalization vs coping skills outside of hospital/at home Increase participation in therapeutic group environment and promote engagement in treatment   Participation Level: Engaged   Participation Quality: Independent   Behavior: Appropriate   Speech/Thought Process: Relevant   Affect/Mood: Appropriate   Insight: Fair   Judgement: Fair      Modes of Intervention: Education  Patient Response to Interventions:  Attentive   Plan: Continue to engage patient in OT groups 2 - 3x/week.  08/28/2024  [place] MATSU Dot, OT  Pio Eatherly, OT

## 2024-08-28 NOTE — Plan of Care (Signed)
  Problem: Activity: Goal: Interest or engagement in activities will improve Outcome: Progressing   Problem: Coping: Goal: Ability to verbalize frustrations and anger appropriately will improve Outcome: Progressing   Problem: Physical Regulation: Goal: Ability to maintain clinical measurements within normal limits will improve Outcome: Progressing

## 2024-08-28 NOTE — Plan of Care (Signed)
  Problem: Education: Goal: Emotional status will improve Outcome: Progressing   Problem: Education: Goal: Emotional status will improve Outcome: Progressing   Problem: Activity: Goal: Interest or engagement in activities will improve Outcome: Progressing Goal: Sleeping patterns will improve Outcome: Progressing   Problem: Safety: Goal: Periods of time without injury will increase Outcome: Progressing

## 2024-08-28 NOTE — Progress Notes (Signed)
   08/28/24 0800  Psych Admission Type (Psych Patients Only)  Admission Status Involuntary  Psychosocial Assessment  Patient Complaints Anxiety  Eye Contact Brief  Facial Expression Anxious  Affect Preoccupied  Speech Soft  Interaction Childlike;Arrogant  Motor Activity Fidgety  Appearance/Hygiene Unremarkable  Behavior Characteristics Cooperative;Appropriate to situation  Mood Preoccupied  Aggressive Behavior  Effect No apparent injury  Thought Process  Coherency Circumstantial  Content Preoccupation  Delusions None reported or observed  Perception WDL  Hallucination None reported or observed  Judgment Impaired  Confusion Mild  Danger to Self  Current suicidal ideation? Denies  Agreement Not to Harm Self Yes  Description of Agreement Verbal  Danger to Others  Danger to Others None reported or observed

## 2024-08-28 NOTE — Plan of Care (Signed)
   Problem: Education: Goal: Emotional status will improve Outcome: Progressing Goal: Mental status will improve Outcome: Progressing   Problem: Activity: Goal: Interest or engagement in activities will improve Outcome: Progressing Goal: Sleeping patterns will improve Outcome: Progressing   Problem: Safety: Goal: Periods of time without injury will increase Outcome: Progressing

## 2024-08-28 NOTE — Group Note (Signed)
 Recreation Therapy Group Note   Group Topic:Self-Esteem  Group Date: 08/28/2024 Start Time: 1005 End Time: 1045 Facilitators: Jveon Pound-McCall, LRT,CTRS Location: [address] Dayroom   Group Topic: Self-Esteem  Goal Area(s) Addresses:  Patient will successfully identify positive attributes about themselves.  Patient will identify healthy ways to increase self-esteem. Patient will acknowledge benefit(s) of improved self-esteem.   Behavioral Response:   Intervention: Sheet with 4 picture frames, Markers, Music  Activity: Pictures of Me. LRT and patients discussed how identify provides direction and meaning to ones life. Patients were to identify 4 ways they identify themselves and draw a picture of how they see themselves in role. Patients were to put each of these identities in one of the picture frames provided on the sheet. LRT and patients discussed what everyone was able to come up with.  Education:  Self-Esteem, Discharge Planning  Education Outcome: Acknowledges education/In group clarification offered/Needs additional education   Affect/Mood: N/A   Participation Level: Did not attend    Clinical Observations/Individualized Feedback:     Plan: Continue to engage patient in RT group sessions 2-3x/week.   Nitasha Jewel-McCall, LRT,CTRS 08/28/2024 12:05 PM

## 2024-08-28 NOTE — Progress Notes (Signed)
 [facility] Inpatient Psychiatry Progress Note  Date: 08/28/2024 Patient: Nancy Howell MRN: [id number]  Nancy Howell is a 48 y.o. F admitted for suicidal ideation.  Assessment and Plan:  Jashira Cotugno is a 48 year old female with schizoaffective disorder, bipolar type, who was previously exhibiting worsening mania and psychosis. The patient has shown notable improvement in agitation and behavioral control as she is now verbally redirectable and no longer aggressive or threatening toward staff. The patient shows continued clinical improvement with increased alertness, improved organization of thought, and resolution of prior psychomotor slowing and stiffness. She is demonstrating greater engagement in treatment and insight into her symptoms, though she continues to show limited understanding of her ongoing medication needs.   Disposition planning remains contingent upon confirmation from [facility] regarding program acceptance and clarification of her reported legal concerns.  # Schizoaffective disorder, cocaine use disorder - Continue Olanzapine 10 mg at bedtime for psychosis  -Continue Depakote 500 mg twice daily for seizure disorder, patient states she had a tubal ligation and uses condoms -Continue Cogentin 2 mg twice daily -10/13: Discontinue Thorazine 150 mg tablets due to EPS -10/11: Discontinued 10/13, Per second opinion of Dr. Raliegh, forced medications of Thorazine tablets twice daily by mouth or Thorazine 50 mg IM plus Benadryl 50 mg IM if patient does not take Thorazine tablets by mouth  # Nicotine Withdrawal - Patient in need of nicotine replacement; nicotine polacrilex (gum) ordered. Smoking cessation encouraged   Risk Assessment: Patient continues to be gravely disabled and unable to care for self due to severe and impairing psychosis.  Discharge Planning: Barriers to Discharge: [facility] application Estimated Length of Stay: 15-20 days Predicted Discharge Location: Possible  substance use program  Interval History: Chart reviewed.  Per nursing report, patient has improved from their lethargic state and is out on the unit more with less stiffness.   On assessment, patient is more alert and active compared to yesterday. She denies any stiffness in tongue and jaw. No delusions noted on assessment and patient denies SI, HI, and AVH. She states she no longer needs the Olanzapine, but was consulted that she should continue since she is showing improvement in disorganized thoughts and behavior. [facility] stated she'd need to resolve an assault charge she told them about during the assessment (we werent aware of any assault charge prior to now). She told [facility] during the phone interview that her court date was back in May in the [place], [place] but she missed it so she isn't sure of the status as of today. When searching the system, however, no such charges could be found in the [place] system. This information was faxed over to [facility] and we are awaiting to see if she would be approved in their program.   Physical Examination:  Vitals and nursing note reviewed  Musculoskeletal: Normal gait and station  Mental Status Exam:  Appearance: Appropriate for environment  Behavior: Calm, cooperative during assessment, no psychomotor agitation or retardation observed  Attitude: More open  Speech: Decreased volume, normal tone and rate.  Childlike tone  Mood: I'm feeling better  Affect: Congruent with stated mood.  Thought Process: Slightly disorganized  Thought Content: No delusions noted  SI/HI: Denies  Perceptions: Denies  Judgement: Lacking  Insight: Lacking  Fund of Knowledge: WNL   Lab Results:  Admission on 08/12/2024  Component Date Value Ref Range Status   Cholesterol 08/16/2024 172  0 - 200 mg/dL Final   Triglycerides 89/95/7974 108  <150 mg/dL Final   HDL  08/16/2024 54  >40 mg/dL Final   Total CHOL/HDL Ratio 08/16/2024 3.2  RATIO Final   VLDL 08/16/2024 22  0 - 40 mg/dL  Final   LDL Cholesterol 08/16/2024 96  0 - 99 mg/dL Final   Hgb J8r MFr Bld 08/16/2024 4.9  4.8 - 5.6 % Final   Mean Plasma Glucose 08/16/2024 93.93  mg/dL Final   RPR Ser Ql 89/95/7974 Reactive (A)  NON REACTIVE Final   RPR Titer 08/16/2024 1:2   Final   T Pallidum Abs 08/16/2024 Reactive (A)  Non Reactive Final     Vitals: Blood pressure (!) 113/94, pulse (!) 110, temperature (!) 97.5 F (36.4 C), temperature source Oral, resp. rate 16, height 5' 1 (1.549 m), weight 59 kg, SpO2 100%.   NB: This note was created using a voice recognition software as a result there may be grammatical errors inadvertently enclosed that do not reflect the nature of this encounter. Every attempt is made to correct such errors.   Alan Maiden, MD PGY-1, Psychiatry Residency  08/28/2024, 8:41 AM

## 2024-08-28 NOTE — Progress Notes (Signed)
(  Sleep Hours) -8  (Any PRNs that were needed, meds refused, or side effects to meds)- Vistaril 25 mg, Trazodone 50 mg  (Any disturbances and when (visitation, over night)-none  (Concerns raised by the patient)- none  (SI/HI/AVH)-denies

## 2024-08-29 MED ORDER — BISACODYL 5 MG PO TBEC
5.0000 mg | DELAYED_RELEASE_TABLET | Freq: Every day | ORAL | Status: DC | PRN
Start: 2024-08-29 — End: 2024-09-03
  Administered 2024-08-30 – 2024-09-02 (×2): 5 mg via ORAL
  Filled 2024-08-29 (×2): qty 1

## 2024-08-29 MED ORDER — BENZTROPINE MESYLATE 1 MG PO TABS
1.0000 mg | ORAL_TABLET | Freq: Two times a day (BID) | ORAL | Status: DC
Start: 2024-08-29 — End: 2024-08-31
  Administered 2024-08-29 – 2024-08-31 (×3): 1 mg via ORAL
  Filled 2024-08-29 (×4): qty 1

## 2024-08-29 MED ORDER — OLANZAPINE 15 MG PO TBDP
15.0000 mg | ORAL_TABLET | Freq: Every day | ORAL | Status: DC
  Administered 2024-08-29: 15 mg via ORAL
  Filled 2024-08-29 (×2): qty 1

## 2024-08-29 NOTE — Group Note (Signed)
 Recreation Therapy Group Note   Group Topic:Health and Wellness  Group Date: 08/29/2024 Start Time: 1020 End Time: 1047 Facilitators: Laneisha Mino-McCall, LRT,CTRS Location: [address] Dayroom   Group Topic: Wellness  Goal Area(s) Addresses:  Patient will define components of whole wellness. Patient will verbalize benefit of whole wellness.  Behavioral Response: Observant  Intervention: Music  Activity: Exercise. Patients took turns leading group in the exercises of their choosing. Patients determined the difficulty of the exercises presented in group. Patients were encouraged not to do any exercise that was to difficult or aggravated any previous injuries. Patients were also encouraged to take breaks or get water as needed.     Education: Wellness, Building control surveyor.   Education Outcome: Acknowledges education/In group clarification offered/Needs additional education.    Affect/Mood: Appropriate   Participation Level: None   Participation Quality: None   Behavior: On-looking   Speech/Thought Process: None   Insight: None   Judgement: None   Modes of Intervention: Music   Patient Response to Interventions:  Attentive   Education Outcome:  In group clarification offered    Clinical Observations/Individualized Feedback: Pt came in as the exercising was wrapping up. Pt observed for the last few minutes.     Plan: Continue to engage patient in RT group sessions 2-3x/week.   Nikolaj Geraghty-McCall, LRT,CTRS 08/29/2024 12:52 PM

## 2024-08-29 NOTE — Progress Notes (Signed)
 [facility] Inpatient Psychiatry Progress Note  Date: 08/29/24 Patient: Nancy Howell MRN: [id number]  Assessment and Plan: Carnisha Feltz is a 48 year old female with a past psychiatric history of schizoaffective disorder bipolar type, GAD and cocaine use disorder with recent cocaine use who presented to ED on 09/30 for suicidal ideation. Since admission she has become involuntary due to paranoia and agitation.   10/17 - Patient continues to be paranoid and mildly disorganized. Her sister is not allowing her to stay with her and continues to insist that she attend rehab, despite a hospitalization of about 18 days at this point. This expectation seems unreasonable, given the alternative of homelessness in an unfamiliar area and without any resources. The patient likely would not fair well if discharged to homelessness in her current psychotic state, and could easily be taken advantage of by others. We will keep her hospitalized at least through the weekend and reassess come Monday. She prefers to return to [facility], and this may be a more realistic plan than remaining in [place] with questionable family support.   # Schizoaffective disorder, cocaine use disorder - Olanzapine ODT 15 mg qhs - Continue Depakote 500 mg twice daily for seizure disorder, patient states she had a tubal ligation and uses condoms - Continue Cogentin 1 mg twice daily  Risk Assessment - Intermediate  Discharge Planning Barriers to discharge: Paranoia Estimated length of stay: 5-7 days Predicted Discharge location: ???    Interval History and update: No significant events overnight. Patient has been compliant with medications. On assessment today she endorsed multiple paranoid themes. She expressed frustration with her sister who continues to demand that she attend inpatient SUD treatment prior to staying with her, and stated that she gave me cancer over the phone, like in that song. She also reported that she has been  feeling nauseated today because of the dope they put in the food. She continues to express uncertainty and anxiety over potentially being homeless in [place], and would much rather return to [facility] as she is familiar with various shelters there.       Physical Exam MSK/Neuro - Normal gait and station  Mental Status Exam Appearance - Casually dressed, appropriate hygiene and grooming  Attitude - Calm, polite, not guarded Speech - normal volume, prosody, inflection, child-like voice Mood - Anxious: Affect - Blunted Thought Process - Linear, GD, illogical Thought Content - various paranoid delusions expressed SI/HI - Denies  Perceptions - Denies AVH Judgement/Insight - poor Fund of knowledge - WNL Language - No impairments      Lab Results:  Admission on 08/12/2024  Component Date Value Ref Range Status   Cholesterol 08/16/2024 172  0 - 200 mg/dL Final   Triglycerides 89/95/7974 108  <150 mg/dL Final   HDL 89/95/7974 54  >40 mg/dL Final   Total CHOL/HDL Ratio 08/16/2024 3.2  RATIO Final   VLDL 08/16/2024 22  0 - 40 mg/dL Final   LDL Cholesterol 08/16/2024 96  0 - 99 mg/dL Final   Hgb J8r MFr Bld 08/16/2024 4.9  4.8 - 5.6 % Final   Mean Plasma Glucose 08/16/2024 93.93  mg/dL Final   RPR Ser Ql 89/95/7974 Reactive (A)  NON REACTIVE Final   RPR Titer 08/16/2024 1:2   Final   T Pallidum Abs 08/16/2024 Reactive (A)  Non Reactive Final     Vitals: Blood pressure 115/84, pulse (!) 120, temperature 97.9 F (36.6 C), temperature source Oral, resp. rate 16, height 5' 1 (1.549 m),  weight 59 kg, SpO2 100%.    Oliva DELENA Salmon, DO

## 2024-08-29 NOTE — Group Note (Signed)
 Date:  08/29/2024 Time:  8:28 PM  Group Topic/Focus:  Wrap-Up Group:   The focus of this group is to help patients review their daily goal of treatment and discuss progress on daily workbooks.    Additional Comments:  Pt was encouraged, but opted out of attending wrap up group this evening.   Nancy Howell 08/29/2024, 8:28 PM

## 2024-08-29 NOTE — Plan of Care (Signed)
   Problem: Education: Goal: Emotional status will improve Outcome: Progressing Goal: Mental status will improve Outcome: Progressing Goal: Verbalization of understanding the information provided will improve Outcome: Progressing

## 2024-08-29 NOTE — Progress Notes (Signed)

## 2024-08-29 NOTE — Group Note (Signed)
 Date:  08/29/2024 Time:  9:08 AM  Group Topic/Focus:  Goals Group:   The focus of this group is to help patients establish daily goals to achieve during treatment and discuss how the patient can incorporate goal setting into their daily lives to aide in recovery.    Participation Level:  Did Not Attend   Nancy Howell 08/29/2024, 9:08 AM

## 2024-08-29 NOTE — [facility] Suicide Risk Assessment (Signed)
 [facility] INPATIENT:  Family/Significant Other Suicide Prevention Education  Suicide Prevention Education:  Family/Significant Other Refusal to Support Patient after Discharge:  Suicide Prevention Education Not Provided:  Patient has identified home of family/significant other as the place the patient will be residing after discharge.  With written consent of the patient, two attempts were made to provide Suicide Prevention Education to  Darin [name] [phone], (name of family member/significant other).  This person indicates he/she will not be responsible for the patient after discharge.  Vignesh Willert M Naila Elizondo 08/29/2024,9:43 AM

## 2024-08-29 NOTE — Progress Notes (Signed)
   08/29/24 0757  Psych Admission Type (Psych Patients Only)  Admission Status Involuntary  Psychosocial Assessment  Patient Complaints Anxiety;Worrying  Eye Contact Brief  Facial Expression Anxious  Affect Preoccupied;Anxious  Speech Logical/coherent  Interaction Childlike;Cautious  Motor Activity Fidgety;Restless  Appearance/Hygiene Unremarkable  Behavior Characteristics Anxious;Fidgety;Restless  Mood Preoccupied;Suspicious  Thought Process  Coherency Circumstantial;Disorganized  Content Preoccupation  Delusions Paranoid  Perception WDL  Hallucination None reported or observed  Judgment Impaired  Confusion Mild  Danger to Self  Current suicidal ideation? Denies  Agreement Not to Harm Self Yes  Description of Agreement Verbal  Danger to Others  Danger to Others None reported or observed

## 2024-08-29 NOTE — Progress Notes (Signed)
(  Sleep Hours) -9.75  (Any PRNs that were needed, meds refused, or side effects to meds)- Vistaril 25 mg  (Any disturbances and when (visitation, over night)- none  (Concerns raised by the patient)- pt complaining she only got 1 cogentin I'm supposed to get 2, my mouth is twitching no EPS observed at this time . Ask me 3 discussed with pt and verbal understanding stated.    (SI/HI/AVH)- denies

## 2024-08-30 NOTE — Plan of Care (Signed)
   Problem: Education: Goal: Emotional status will improve Outcome: Progressing Goal: Mental status will improve Outcome: Progressing Goal: Verbalization of understanding the information provided will improve Outcome: Progressing   Problem: Coping: Goal: Ability to verbalize frustrations and anger appropriately will improve Outcome: Progressing Goal: Ability to demonstrate self-control will improve Outcome: Progressing

## 2024-08-30 NOTE — [facility] Group Notes (Signed)
 Adult Psychoeducational Group Note  Date:  08/30/2024 Time:  8:39 PM  Group Topic/Focus:  Wrap-Up Group:   The focus of this group is to help patients review their daily goal of treatment and discuss progress on daily workbooks.  Participation Level:  Did Not Attend  Aisha Celestine Ruth 08/30/2024, 8:39 PM

## 2024-08-30 NOTE — Progress Notes (Signed)
   08/30/24 0830  Psych Admission Type (Psych Patients Only)  Admission Status Involuntary  Psychosocial Assessment  Patient Complaints Anxiety;Worrying  Eye Contact Brief  Facial Expression Anxious  Affect Preoccupied  Speech Soft  Interaction Cautious  Motor Activity Rigidity  Appearance/Hygiene Unremarkable  Behavior Characteristics Cooperative  Mood Preoccupied  Thought Process  Coherency Disorganized;Circumstantial  Content Preoccupation  Delusions None reported or observed  Perception WDL  Hallucination None reported or observed  Judgment Impaired  Confusion Mild  Danger to Self  Current suicidal ideation? Denies  Danger to Others  Danger to Others None reported or observed

## 2024-08-30 NOTE — Group Note (Signed)
 [facility] Group Therapy Note  Group Date: 08/30/2024 Start Time: 1005 End Time: 1105   Type of Therapy and Topic:  Healthy vs Unhealthy Coping Skills - Museum/gallery exhibitions officer in Life. Participation Level:  Did Not Attend    Hunter JONELLE Alto ISRAEL 08/30/2024  4:16 PM

## 2024-08-30 NOTE — Progress Notes (Signed)
 [facility] Inpatient Psychiatry Progress Note  Date: 08/30/24 Patient: Nancy Howell MRN: [id number]  Assessment and Plan: Nancy Howell is a 48 year old female with a past psychiatric history of schizoaffective disorder bipolar type, GAD and cocaine use disorder with recent cocaine use who presented to ED on 09/30 for suicidal ideation. Since admission she has become involuntary due to paranoia and agitation.   10/18 - Patient continues to be paranoid and mildly disorganized. Her sister is not allowing her to stay with her and continues to insist that she attend rehab, despite a hospitalization of about 18 days at this point. This expectation seems unreasonable, given the alternative of homelessness in an unfamiliar area and without any resources. The patient likely would not fair well if discharged to homelessness in her current psychotic state, and could easily be taken advantage of by others. We will keep her hospitalized at least through the weekend and reassess come Monday. She prefers to return to [facility], and this may be a more realistic plan than remaining in [place] with questionable family support.   # Schizoaffective disorder, cocaine use disorder - Olanzapine ODT 15 mg qhs - Continue Depakote 500 mg twice daily for seizure disorder, patient states she had a tubal ligation and uses condoms - Cogentin 1 mg twice daily  Risk Assessment - Intermediate  Discharge Planning Barriers to discharge: Paranoia Estimated length of stay: 5-7 days Predicted Discharge location: ???    Interval History and update: No significant events overnight. Patient has been med compliant. She has been sleeping most of the day. I attempted to assess her four times throughout the day and she was asleep each time in her bed. She was briefly up this morning and requested information surrounding her discharge but otherwise has been keeping to herself in her room. She continues to be at least mildly  disorganized and staff report that her responses are somewhat latent at times, as if she is RIS.        Mental Status Exam Unable to perform. Patient asleep in bed, not easily roused.       Lab Results:  Admission on 08/12/2024  Component Date Value Ref Range Status   Cholesterol 08/16/2024 172  0 - 200 mg/dL Final   Triglycerides 89/95/7974 108  <150 mg/dL Final   HDL 89/95/7974 54  >40 mg/dL Final   Total CHOL/HDL Ratio 08/16/2024 3.2  RATIO Final   VLDL 08/16/2024 22  0 - 40 mg/dL Final   LDL Cholesterol 08/16/2024 96  0 - 99 mg/dL Final   Hgb J8r MFr Bld 08/16/2024 4.9  4.8 - 5.6 % Final   Mean Plasma Glucose 08/16/2024 93.93  mg/dL Final   RPR Ser Ql 89/95/7974 Reactive (A)  NON REACTIVE Final   RPR Titer 08/16/2024 1:2   Final   T Pallidum Abs 08/16/2024 Reactive (A)  Non Reactive Final     Vitals: Blood pressure 115/84, pulse (!) 120, temperature 97.9 F (36.6 C), temperature source Oral, resp. rate 16, height 5' 1 (1.549 m), weight 59 kg, SpO2 100%.    Oliva DELENA Salmon, DO

## 2024-08-31 MED ORDER — OLANZAPINE 10 MG PO TBDP: 10.0000 mg | ORAL_TABLET | Freq: Every day | ORAL | Status: DC

## 2024-08-31 MED ORDER — OLANZAPINE 7.5 MG PO TABS: 7.5000 mg | ORAL_TABLET | Freq: Every day | ORAL | Status: DC

## 2024-08-31 MED ORDER — BENZTROPINE MESYLATE 1 MG PO TABS
2.0000 mg | ORAL_TABLET | Freq: Two times a day (BID) | ORAL | Status: DC
Start: 2024-08-31 — End: 2024-09-03
  Administered 2024-08-31 – 2024-09-03 (×6): 2 mg via ORAL
  Filled 2024-08-31: qty 28
  Filled 2024-08-31: qty 2
  Filled 2024-08-31 (×3): qty 28
  Filled 2024-08-31: qty 2
  Filled 2024-08-31: qty 28
  Filled 2024-08-31: qty 2
  Filled 2024-08-31: qty 28
  Filled 2024-08-31: qty 2
  Filled 2024-08-31: qty 28
  Filled 2024-08-31 (×2): qty 2

## 2024-08-31 NOTE — Plan of Care (Signed)
   Problem: Education: Goal: Knowledge of [facility] Education information/materials will improve Outcome: Progressing Goal: Emotional status will improve Outcome: Progressing Goal: Mental status will improve Outcome: Progressing Goal: Verbalization of understanding the information provided will improve Outcome: Progressing

## 2024-08-31 NOTE — Plan of Care (Signed)
   Problem: Education: Goal: Mental status will improve Outcome: Progressing Goal: Verbalization of understanding the information provided will improve Outcome: Progressing   Problem: Activity: Goal: Interest or engagement in activities will improve Outcome: Progressing Goal: Sleeping patterns will improve Outcome: Progressing

## 2024-08-31 NOTE — BH Assessment (Signed)
(  Sleep Hours) - 10.75 (Any PRNs that were needed, meds refused, or side effects to meds)-  (Any disturbances and when (visitation, over night)- None (Concerns raised by the patient)- Pt remained sleep during 7p-7a shift. Refused all meds.  (SI/HI/AVH)-Denied but stayed sleep the entire shift.

## 2024-08-31 NOTE — Progress Notes (Signed)
 [facility] Inpatient Psychiatry Progress Note  Date: 08/31/24 Patient: Nancy Howell MRN: [id number]  Assessment and Plan: Nancy Howell is a 48 year old female with a past psychiatric history of schizoaffective disorder bipolar type, GAD and cocaine use disorder with recent cocaine use who presented to ED on 09/30 for suicidal ideation. Since admission she has become involuntary due to paranoia and agitation.   10/18 - Patient continues to be paranoid and mildly disorganized. Her sister is not allowing her to stay with her and continues to insist that she attend rehab, despite a hospitalization of about 18 days at this point. This expectation seems unreasonable, given the alternative of homelessness in an unfamiliar area and without any resources. The patient likely would not fair well if discharged to homelessness in her current psychotic state, and could easily be taken advantage of by others. We will keep her hospitalized at least through the weekend and reassess come Monday. She prefers to return to [facility], and this may be a more realistic plan than remaining in [place] with questionable family support.   10/19 - Patient very sedated the past two days, coinciding with increasing olanzapine to target continuing delusional paranoia. I stopped olanzapine for this evening. Consider restarting tomorrow at 5-10 mg at bedtime depending upon her presentation.   # Schizoaffective disorder, cocaine use disorder - Hold olanzapine. - Continue Depakote 500 mg twice daily for seizure disorder, patient states she had a tubal ligation and uses condoms - Cogentin 2 mg twice daily - Last Invega Trinza 546 mg administered 07/28/24  Risk Assessment - Intermediate  Discharge Planning Barriers to discharge: Paranoia Estimated length of stay: 5-7 days Predicted Discharge location: ???    Interval History and update: No significant events overnight. Patient has been asleep the majority of the day.  Discussed her presentation extensively with staff. She has required heavy encouragement to both eat and drink by staff and reports that they continues to make her feel ill. She did not participate in assessment today, ostensibly on account of sedation.      Mental Status Exam Unable to perform. Patient asleep in bed, not easily roused.       Lab Results:  Admission on 08/12/2024  Component Date Value Ref Range Status   Cholesterol 08/16/2024 172  0 - 200 mg/dL Final   Triglycerides 89/95/7974 108  <150 mg/dL Final   HDL 89/95/7974 54  >40 mg/dL Final   Total CHOL/HDL Ratio 08/16/2024 3.2  RATIO Final   VLDL 08/16/2024 22  0 - 40 mg/dL Final   LDL Cholesterol 08/16/2024 96  0 - 99 mg/dL Final   Hgb J8r MFr Bld 08/16/2024 4.9  4.8 - 5.6 % Final   Mean Plasma Glucose 08/16/2024 93.93  mg/dL Final   RPR Ser Ql 89/95/7974 Reactive (A)  NON REACTIVE Final   RPR Titer 08/16/2024 1:2   Final   T Pallidum Abs 08/16/2024 Reactive (A)  Non Reactive Final     Vitals: Blood pressure 106/78, pulse (!) 101, temperature 97.6 F (36.4 C), temperature source Oral, resp. rate 19, height 5' 1 (1.549 m), weight 59 kg, SpO2 99%.    Oliva DELENA Salmon, DO

## 2024-08-31 NOTE — Progress Notes (Signed)
   08/31/24 0915  Psych Admission Type (Psych Patients Only)  Admission Status Involuntary  Psychosocial Assessment  Patient Complaints None  Eye Contact Brief  Facial Expression Anxious  Affect Preoccupied  Speech Slow  Interaction Isolative  Motor Activity Other (Comment) (WNL for pt)  Appearance/Hygiene Disheveled  Behavior Characteristics Cooperative  Mood Preoccupied  Thought Process  Coherency Disorganized  Content Preoccupation  Delusions None reported or observed  Perception WDL  Hallucination None reported or observed  Judgment Impaired  Confusion None  Danger to Self  Current suicidal ideation? Denies  Agreement Not to Harm Self Yes  Description of Agreement verbal  Danger to Others  Danger to Others None reported or observed

## 2024-09-01 ENCOUNTER — Encounter (HOSPITAL_COMMUNITY): Payer: Self-pay

## 2024-09-01 MED ORDER — METRONIDAZOLE 500 MG PO TABS
500.0000 mg | ORAL_TABLET | Freq: Two times a day (BID) | ORAL | Status: DC
  Administered 2024-09-01 – 2024-09-03 (×5): 500 mg via ORAL
  Filled 2024-09-01 (×5): qty 1
  Filled 2024-09-01: qty 10

## 2024-09-01 NOTE — Group Note (Signed)
 LCSW Group Therapy Note   Group Date: 09/01/2024 Start Time: 1300 End Time: 1350   Type of Therapy and Topic:  Group Therapy: Building Healthy Relationships & Boundaries  Participation Level:  Did Not Attend  Description of Group: This group will address the use of boundaries in their personal lives. Patients will explore why boundaries are important, the difference between healthy and unhealthy boundaries, and negative and postive outcomes of different boundaries and will look at how boundaries can be crossed.  Patients will be encouraged to identify current boundaries in their own lives and identify what kind of boundary is being set. Facilitators will guide patients in utilizing problem-solving interventions to address and correct types boundaries being used and to address when no boundary is being used. Understanding and applying boundaries will be explored and addressed for obtaining and maintaining a balanced life. Patients will be encouraged to explore ways to assertively make their boundaries and needs known to significant others in their lives, using other group members and facilitator for role play, support, and feedback. Participants discussed loneliness, healthy connections, and setting boundaries. They explored safe ways to meet people and shared personal experiences. Key insights were reinforced through discussion and quotes.  Therapeutic Goals:  1.  Patient will identify areas in their life where setting clear boundaries could be  used to improve their life.  2.  Patient will identify signs/triggers that a boundary is not being respected. 3.  Patient will identify two ways to set boundaries in order to achieve balance in  their lives: 4.  Patient will demonstrate ability to communicate their needs and set boundaries  through discussion and/or role plays  Summary of Patient Progress:  Patient did not attend.   Therapeutic Modalities:   Cognitive Behavioral  Therapy Solution-Focused Therapy  Nancy Howell, LCSWA 09/01/2024  4:06 PM

## 2024-09-01 NOTE — Progress Notes (Signed)
 [facility] Inpatient Psychiatry Progress Note  Date: 09/01/24 Patient: Nancy Howell MRN: [id number]  Assessment and Plan: Jamesyn Moorefield is a 48 year old female with a past psychiatric history of schizoaffective disorder bipolar type, GAD and cocaine use disorder with recent cocaine use who presented to ED on 09/30 for suicidal ideation. Since admission she has become involuntary due to paranoia and agitation.   Patient continues to be slightly sedated in the mornings, but nursing staff states patient will get out of bed in the afternoon and walk around the unit.  This afternoon patient was seen walking around the unit with no issues in gait.  We will order metronidazole for reported foul vaginal discharge in order Depakote level, lipase, and CBC to evaluate the abdominal pain.  Discharge is planned for 10/22. Discharge is complicated by lack of support and patient wanting to return to [facility] where her sister picked her up from. Social is working on [employer], museum/gallery.   # Schizoaffective disorder, cocaine use disorder - Continue to hold olanzapine due to increased drowsiness - Depakote level ordered for 10/21 in a.m. - Continue Depakote 500 mg twice daily for seizure disorder, patient states she had a tubal ligation and uses condoms - Continue Cogentin 2 mg twice daily  - Last Invega Trinza 546 mg administered 07/28/24  # Abdominal Pain -Order lipase, CBC, and Depakote level to evaluate - Start metronidazole 500 mg twice daily for 7 days for reported foul discharge  Risk Assessment - Intermediate  Discharge Planning Barriers to discharge: Location of discharge Estimated length of stay: 20-30 days Predicted Discharge location: ???   Interval History and update: Chart reviewed.  No significant events overnight.  PRNs needed include hydroxyzine and trazodone.   Per nursing staff, patient stays in bed most in the morning but then is active and walking on the staff in the afternoon.  On  assessment today, patient is lying in bed complaining of abdominal pain.  Patient states she is continuing to have foul order and yellow thick discharge and that she believes she has a bacterial infection and asked for Flagyl. Patient denies AVH, SI, and HI. There are no delusions present.   Collateral via phone Sister, [name]:  Sister last spoke to [place] on Saturday and states she is still endorsing being poisoned by the staff and we are trying to overdose her on her medications. Sister states she doesn't know why Beatriz says she doesn't have support in [place], as all of her family is here but they dont want to take her since she is still psychotic and needs to go to rehab first. Sister wants an update from Social about medicaid, she was told they were working on that for her. Sister said she will not let her go to [facility].   Mental Status Exam  Appearance: Appropriate for environment  Behavior: Calm, cooperative during assessment, no psychomotor agitation or retardation observed  Attitude: More open  Speech: Decreased volume, normal tone and rate.  Childlike tone  Mood: Im okay but my stomach still hurts  Affect: Flat  Thought Process: Slightly disorganized  Thought Content: No delusions noted  SI/HI: Denies  Perceptions: Denies  Judgement: Lacking  Insight: Lacking  Fund of Knowledge: WNL         Lab Results:  Admission on 08/12/2024  Component Date Value Ref Range Status   Cholesterol 08/16/2024 172  0 - 200 mg/dL Final   Triglycerides 89/95/7974 108  <150 mg/dL Final   HDL 89/95/7974  54  >40 mg/dL Final   Total CHOL/HDL Ratio 08/16/2024 3.2  RATIO Final   VLDL 08/16/2024 22  0 - 40 mg/dL Final   LDL Cholesterol 08/16/2024 96  0 - 99 mg/dL Final   Hgb J8r MFr Bld 08/16/2024 4.9  4.8 - 5.6 % Final   Mean Plasma Glucose 08/16/2024 93.93  mg/dL Final   RPR Ser Ql 89/95/7974 Reactive (A)  NON REACTIVE Final   RPR Titer 08/16/2024 1:2   Final   T Pallidum Abs 08/16/2024 Reactive (A)  Non  Reactive Final     Vitals: Blood pressure 101/78, pulse (!) 122, temperature 98.1 F (36.7 C), temperature source Oral, resp. rate 18, height 5' 1 (1.549 m), weight 59 kg, SpO2 100%.    Alan Maiden, MD

## 2024-09-01 NOTE — Plan of Care (Signed)
   Problem: Education: Goal: Emotional status will improve Outcome: Progressing Goal: Mental status will improve Outcome: Progressing Goal: Verbalization of understanding the information provided will improve Outcome: Progressing

## 2024-09-01 NOTE — Progress Notes (Signed)
   08/31/24 2038  Psych Admission Type (Psych Patients Only)  Admission Status Involuntary  Psychosocial Assessment  Patient Complaints None  Eye Contact Brief  Facial Expression Anxious  Affect Preoccupied  Speech Slow  Interaction Childlike;Isolative  Motor Activity Other (Comment) (WDL)  Appearance/Hygiene Disheveled  Behavior Characteristics Cooperative  Mood Preoccupied  Thought Process  Coherency Disorganized  Content Preoccupation  Delusions None reported or observed  Perception WDL  Hallucination None reported or observed  Judgment Impaired  Confusion None  Danger to Self  Current suicidal ideation? Denies  Agreement Not to Harm Self Yes  Description of Agreement Verbal  Danger to Others  Danger to Others None reported or observed

## 2024-09-01 NOTE — Progress Notes (Signed)
(  Sleep Hours) - 13.25 (Any PRNs that were needed, meds refused, or side effects to meds)- PRN vistaril 25 mg and trazodone 50 mg given, no meds refused.  (Any disturbances and when (visitation, over night)- None  (Concerns raised by the patient)- None  (SI/HI/AVH)- Denies SI/HI/AVH

## 2024-09-01 NOTE — Group Note (Signed)
 Date:  09/01/2024 Time:  9:21 AM  Group Topic/Focus:  Goals Group:   The focus of this group is to help patients establish daily goals to achieve during treatment and discuss how the patient can incorporate goal setting into their daily lives to aide in recovery.    Participation Level:  Did Not Attend  Participation Quality:    Affect:    Cognitive:    Insight:   Engagement in Group:    Modes of Intervention:    Additional Comments:    Nancy Howell 09/01/2024, 9:21 AM

## 2024-09-01 NOTE — BH IP Treatment Plan (Signed)
 Interdisciplinary Treatment and Diagnostic Plan Update  09/01/2024 Time of Session: 10:00AM - UPDATE Nancy Howell MRN: [id number]  Principal Diagnosis: Severe stimulant use disorder (HCC)  Secondary Diagnoses: Principal Problem:   Severe stimulant use disorder (HCC) Active Problems:   GAD (generalized anxiety disorder)   Schizoaffective disorder (HCC)   Current Medications:  Current Facility-Administered Medications  Medication Dose Route Frequency Provider Last Rate Last Admin   acetaminophen (TYLENOL) tablet 1,000 mg  1,000 mg Oral Q6H PRN McCarty, Artie, MD   1,000 mg at 08/31/24 1855   alum & mag hydroxide-simeth (MAALOX/MYLANTA) 200-200-20 MG/5ML suspension 30 mL  30 mL Oral Q4H PRN Motley-Mangrum, Jadeka A, PMHNP   30 mL at 08/30/24 1732   benztropine (COGENTIN) tablet 2 mg  2 mg Oral BID Prentis Kitchens A, DO   2 mg at 09/01/24 0900   bisacodyl (DULCOLAX) EC tablet 5 mg  5 mg Oral Daily PRN Prentis Kitchens A, DO   5 mg at 08/30/24 1732   haloperidol  (HALDOL ) tablet 5 mg  5 mg Oral TID PRN Motley-Mangrum, Jadeka A, PMHNP   5 mg at 08/19/24 1546   And   diphenhydrAMINE (BENADRYL) capsule 50 mg  50 mg Oral TID PRN Motley-Mangrum, Jadeka A, PMHNP   50 mg at 08/19/24 1546   haloperidol  lactate (HALDOL ) injection 5 mg  5 mg Intramuscular TID PRN Motley-Mangrum, Jadeka A, PMHNP       And   diphenhydrAMINE (BENADRYL) injection 50 mg  50 mg Intramuscular TID PRN Motley-Mangrum, Jadeka A, PMHNP       And   LORazepam (ATIVAN) injection 2 mg  2 mg Intramuscular TID PRN Motley-Mangrum, Jadeka A, PMHNP       haloperidol  lactate (HALDOL ) injection 10 mg  10 mg Intramuscular TID PRN Motley-Mangrum, Jadeka A, PMHNP       And   diphenhydrAMINE (BENADRYL) injection 50 mg  50 mg Intramuscular TID PRN Motley-Mangrum, Jadeka A, PMHNP       And   LORazepam (ATIVAN) injection 2 mg  2 mg Intramuscular TID PRN Motley-Mangrum, Jadeka A, PMHNP       divalproex (DEPAKOTE) DR tablet 500 mg  500 mg Oral Q12H  Elodie Palma, MD   500 mg at 09/01/24 0900   feeding supplement (ENSURE PLUS HIGH PROTEIN) liquid 237 mL  237 mL Oral BID BM Marry Clamp, MD   237 mL at 08/31/24 1012   fluticasone (FLONASE) 50 MCG/ACT nasal spray 1 spray  1 spray Each Nare Q12H PRN Towana Leita SAILOR, MD   1 spray at 08/22/24 2029   fluticasone (FLONASE) 50 MCG/ACT nasal spray 2 spray  2 spray Each Nare Daily McCarty, Artie, MD   2 spray at 09/01/24 0900   hydrOXYzine (ATARAX) tablet 25 mg  25 mg Oral TID PRN Motley-Mangrum, Jadeka A, PMHNP   25 mg at 08/31/24 2038   ibuprofen (ADVIL) tablet 600 mg  600 mg Oral Q6H PRN McCarty, Artie, MD   600 mg at 09/01/24 1338   lidocaine (LIDODERM) 5 % 1 patch  1 patch Transdermal Daily Mannie Ashley SAILOR, MD   1 patch at 08/29/24 0841   magnesium hydroxide (MILK OF MAGNESIA) suspension 30 mL  30 mL Oral Daily PRN Motley-Mangrum, Jadeka A, PMHNP   30 mL at 08/29/24 0924   metroNIDAZOLE (FLAGYL) tablet 500 mg  500 mg Oral BID Elodie Palma, MD   500 mg at 09/01/24 1110   nicotine polacrilex (NICORETTE) gum 2 mg  2 mg Oral PRN Motley-Mangrum, Jadeka  A, PMHNP   2 mg at 09/01/24 1338   sodium chloride (OCEAN) 0.65 % nasal spray 1 spray  1 spray Each Nare PRN Towana Leita SAILOR, MD       traZODone (DESYREL) tablet 50 mg  50 mg Oral QHS PRN Bobbitt, Shalon E, NP   50 mg at 08/31/24 2038   PTA Medications: Medications Prior to Admission  Medication Sig Dispense Refill Last Dose/Taking   benztropine (COGENTIN) 2 MG tablet Take 2 mg by mouth 2 (two) times daily.   08/13/2024 Morning   diphenhydrAMINE (BENADRYL) 25 mg capsule Take 25 mg by mouth daily.   08/12/2024   Paliperidone Palmitate (INVEGA SUSTENNA IM) Inject into the muscle. Pt received at [facility] [phone] pharmcacy refused to provide dosing and administration information   Taking    Patient Stressors: Financial difficulties   Loss of father (He died a year ago)   Substance abuse    Patient Strengths: Capable of  independent living  Communication skills  Supportive family/friends   Treatment Modalities: Medication Management, Group therapy, Case management,  1 to 1 session with clinician, Psychoeducation, Recreational therapy.   Physician Treatment Plan for Primary Diagnosis: Severe stimulant use disorder (HCC) Long Term Goal(s):     Short Term Goals:    Medication Management: Evaluate patient's response, side effects, and tolerance of medication regimen.  Therapeutic Interventions: 1 to 1 sessions, Unit Group sessions and Medication administration.  Evaluation of Outcomes: Progressing  Physician Treatment Plan for Secondary Diagnosis: Principal Problem:   Severe stimulant use disorder (HCC) Active Problems:   GAD (generalized anxiety disorder)   Schizoaffective disorder (HCC)  Long Term Goal(s):     Short Term Goals:       Medication Management: Evaluate patient's response, side effects, and tolerance of medication regimen.  Therapeutic Interventions: 1 to 1 sessions, Unit Group sessions and Medication administration.  Evaluation of Outcomes: Progressing   RN Treatment Plan for Primary Diagnosis: Severe stimulant use disorder (HCC) Long Term Goal(s): Knowledge of disease and therapeutic regimen to maintain health will improve  Short Term Goals: Ability to remain free from injury will improve, Ability to verbalize frustration and anger appropriately will improve, Ability to demonstrate self-control, Ability to participate in decision making will improve, and Ability to verbalize feelings will improve  Medication Management: RN will administer medications as ordered by provider, will assess and evaluate patient's response and provide education to patient for prescribed medication. RN will report any adverse and/or side effects to prescribing provider.  Therapeutic Interventions: 1 on 1 counseling sessions, Psychoeducation, Medication administration, Evaluate responses to treatment,  Monitor vital signs and CBGs as ordered, Perform/monitor CIWA, COWS, AIMS and Fall Risk screenings as ordered, Perform wound care treatments as ordered.  Evaluation of Outcomes: Progressing   LCSW Treatment Plan for Primary Diagnosis: Severe stimulant use disorder (HCC) Long Term Goal(s): Safe transition to appropriate next level of care at discharge, Engage patient in therapeutic group addressing interpersonal concerns.  Short Term Goals: Engage patient in aftercare planning with referrals and resources, Increase social support, Increase ability to appropriately verbalize feelings, Increase emotional regulation, and Facilitate acceptance of mental health diagnosis and concerns  Therapeutic Interventions: Assess for all discharge needs, 1 to 1 time with Social worker, Explore available resources and support systems, Assess for adequacy in community support network, Educate family and significant other(s) on suicide prevention, Complete Psychosocial Assessment, Interpersonal group therapy.  Evaluation of Outcomes: Progressing   Progress in Treatment: Attending groups: attended some groups Participating in  groups: Yes Taking medication as prescribed:  Yes Toleration medication:  Yes Family/Significant other contact made: No, will contact:  Sister, [name] [phone].  First attempt: 08/13/24 @ 350PM.  Second attempt:  second attempt: 08/14/24 @ 150PM.   Patient understands diagnosis: Yes. Discussing patient identified problems/goals with staff: Yes. Medical problems stabilized or resolved: Yes. Denies suicidal/homicidal ideation: Yes. Issues/concerns per patient self-inventory: No.   New problem(s) identified: No, Describe:  none reported    New Short Term/Long Term Goal(s): detox, medication management for mood stabilization; elimination of SI thoughts; development of comprehensive mental wellness/sobriety plan   Patient Goals:  Figure out my SSI and possibly go to inpatient  rehab   Discharge Plan or Barriers: Patient recently admitted. CSW will continue to follow and assess for appropriate referrals and possible discharge planning.      Reason for Continuation of Hospitalization: Anxiety Medication stabilization Suicidal ideation   Estimated Length of Stay: 3 - 4 days  Last 3 [place] Suicide Severity Risk Score: Flowsheet Row [facility] (Current[facility] Most recent reading at 08/12/2024  9:00 PM ED from 08/12/2024 in [facility] Emergency Department [facility] Most recent reading at 08/12/2024  9:54 AM  C-SSRS RISK CATEGORY Low Risk Low Risk    Last PHQ 2/9 Scores:     No data to display          Scribe for Treatment Team: Deshan Hemmelgarn M Aron Needles, ISRAEL 09/01/2024 3:04 PM

## 2024-09-01 NOTE — Group Note (Signed)
 Recreation Therapy Group Note   Group Topic:General Recreation  Group Date: 09/01/2024 Start Time: 1010 End Time: 1030 Facilitators: Jacquelene Kopecky-McCall, LRT,CTRS Location: [address] Dayroom   Group Topic/Focus: General Recreation   Goal Area(s) Addresses:  Patient will use appropriate interactions with peers.   Patient will follow directions.  Behavioral Response:   Intervention: Art and Movies  Activity: Patient with peers allowed  the opportunity to engage in art activities or watching a movie during recreation therapy group session today.    Affect/Mood: N/A   Participation Level: Did not attend    Clinical Observations/Individualized Feedback:     Plan: Continue to engage patient in RT group sessions 2-3x/week.   Chalmer Zheng-McCall, LRT,CTRS 09/01/2024 1:07 PM

## 2024-09-02 DIAGNOSIS — R109 Unspecified abdominal pain: Secondary | ICD-10-CM

## 2024-09-02 LAB — CBC WITH DIFFERENTIAL/PLATELET
Abs Immature Granulocytes: 0.01 K/uL (ref 0.00–0.07)
Basophils Absolute: 0 K/uL (ref 0.0–0.1)
Basophils Relative: 0 %
Eosinophils Absolute: 0.3 K/uL (ref 0.0–0.5)
Eosinophils Relative: 6 %
HCT: 34.7 % — ABNORMAL LOW (ref 36.0–46.0)
Hemoglobin: 10.8 g/dL — ABNORMAL LOW (ref 12.0–15.0)
Immature Granulocytes: 0 %
Lymphocytes Relative: 44 %
Lymphs Abs: 2.1 K/uL (ref 0.7–4.0)
MCH: 25.7 pg — ABNORMAL LOW (ref 26.0–34.0)
MCHC: 31.1 g/dL (ref 30.0–36.0)
MCV: 82.6 fL (ref 80.0–100.0)
Monocytes Absolute: 0.5 K/uL (ref 0.1–1.0)
Monocytes Relative: 9 %
Neutro Abs: 2 K/uL (ref 1.7–7.7)
Neutrophils Relative %: 41 %
Platelets: 188 K/uL (ref 150–400)
RBC: 4.2 MIL/uL (ref 3.87–5.11)
RDW: 16.2 % — ABNORMAL HIGH (ref 11.5–15.5)
Smear Review: NORMAL
WBC: 4.9 K/uL (ref 4.0–10.5)
nRBC: 0 % (ref 0.0–0.2)

## 2024-09-02 LAB — LIPASE, BLOOD: Lipase: 21 U/L (ref 11–51)

## 2024-09-02 LAB — VALPROIC ACID LEVEL: Valproic Acid Lvl: 65 ug/mL (ref 50–100)

## 2024-09-02 MED ORDER — OLANZAPINE 10 MG PO TABS
10.0000 mg | ORAL_TABLET | Freq: Every day | ORAL | Status: DC
  Administered 2024-09-02: 10 mg via ORAL
  Filled 2024-09-02: qty 1

## 2024-09-02 NOTE — Group Note (Signed)
 Recreation Therapy Group Note   Group Topic:Animal Assisted Therapy   Group Date: 09/02/2024 Start Time: 9052 End Time: 1030 Facilitators: Jawara Latorre-McCall, LRT,CTRS Location: [address] Dayroom   Animal-Assisted Activity (AAA) Program Checklist/Progress Notes Patient Eligibility Criteria Checklist & Daily Group note for Rec Tx Intervention  AAA/T Program Assumption of Risk Form signed by Patient/ or Parent Legal Guardian Yes  Patient is free of allergies or severe asthma Yes  Patient reports no fear of animals Yes  Patient reports no history of cruelty to animals Yes  Patient understands his/her participation is voluntary Yes  Patient washes hands before animal contact Yes  Patient washes hands after animal contact Yes  Behavioral Response: Attentive   Education: Hand Washing, Appropriate Animal Interaction   Education Outcome: Acknowledges education.    Affect/Mood: Appropriate   Participation Level: Minimal   Participation Quality: Independent   Behavior: Attentive    Speech/Thought Process: Barely audible    Insight: Fair   Judgement: Fair    Modes of Intervention: Teaching laboratory technician   Patient Response to Interventions:  Attentive   Education Outcome:  In group clarification offered    Clinical Observations/Individualized Feedback: Pt was attentive and soft spoken during group. Pt was appropriate and shared a story about her dog at home.     Plan: Continue to engage patient in RT group sessions 2-3x/week.   Bettyann Birchler-McCall, LRT,CTRS 09/02/2024 1:05 PM

## 2024-09-02 NOTE — Progress Notes (Signed)
 [facility] Inpatient Psychiatry Progress Note  Date: 09/02/24 Patient: Nancy Howell MRN: [id number]  Assessment and Plan: Dailey Alberson is a 48 year old female with a past psychiatric history of schizoaffective disorder bipolar type, GAD and cocaine use disorder with recent cocaine use who presented to ED on 09/30 for suicidal ideation. Since admission she has become involuntary due to paranoia and agitation.   Psychiatrically, patient appears stable and near her baseline, as evidenced by the absence of acute psychotic symptoms and stabilization of mood. However, cognitively, she demonstrates significant impairment in executive functioning, making her unable to safely care for herself independently. Her disorganized thought process and inability to manage basic needs indicate she would not be an appropriate candidate for placement at [facility], as she lacks the capacity to function safely in a less structured environment.  If her sister declines to assume care, the team will collaborate closely with social work to identify a more supportive and structured discharge option. The plan is to continue current medications, supportive therapy, and safety monitoring while discharge planning remains ongoing.   # Schizoaffective disorder, cocaine use disorder - Start olanzapine 10 mg for continued stabilization of psychosis - Depakote level ordered for 10/21 in a.m. - Continue Depakote 500 mg twice daily for seizure disorder, patient states she had a tubal ligation and uses condoms - Continue Cogentin 2 mg twice daily  - Last Invega Trinza 546 mg administered 07/28/24  # Abdominal Pain -Order lipase, CBC, and Depakote level to evaluate - Start metronidazole 500 mg twice daily for 7 days for reported foul discharge  Risk Assessment - Intermediate  Discharge Planning Barriers to discharge: Location of discharge Estimated length of stay: 20-30 days Predicted Discharge location: ???   Interval  History and update: Chart reviewed.  No significant events overnight.  PRNs needed include hydroxyzine, trazodone, and Doculax.   On assessment, the patient was observed sitting in a chair, gently rocking back and forth. When asked if she was anxious, she stated she was not, explaining that the movement helps her stay calm. She continues to express paranoid beliefs regarding her sister, stating, "My sister is crazy. She just wants me to die so she can have all of my money. Why did she grab me from [facility]? Where am I supposed to go--just walk the streets in the dark where I can't see my hand in front of my face?"  When asked about her vaginal discharge, the patient stated that it has improved and no longer has a foul odor. She reiterated past delusional beliefs that individuals in [place]  were "putting vaginal juices in her food," though she denied believing that staff here were doing the same. She currently denies SI, HI, and AVH.  She continues to request a bus pass back to [place] , reporting she has "friends that are basically family" there, though she does not have actual family in the area. She states she wants to return before December to reapply for food stamps. Later in the evening, the patient told nursing staff that if discharged to [facility], she would "have the same symptoms" as another patient on the unit who was acutely psychotic and loudly screaming.    Mental Status Exam  Appearance: Appropriate for environment  Behavior: Calm, cooperative during assessment, rocking back and forth  Attitude: More open  Speech: Decreased volume, normal tone and rate.  Childlike tone  Mood: Im okay, just stressed about IRC  Affect: Flat  Thought Process: Slightly disorganized  Thought  Content: No delusions noted  SI/HI: Denies  Perceptions: Denies  Judgement: Lacking  Insight: Lacking  Fund of Knowledge: WNL         Lab Results:  Admission on 08/12/2024  Component Date Value Ref Range Status    Cholesterol 08/16/2024 172  0 - 200 mg/dL Final   Triglycerides 89/95/7974 108  <150 mg/dL Final   HDL 89/95/7974 54  >40 mg/dL Final   Total CHOL/HDL Ratio 08/16/2024 3.2  RATIO Final   VLDL 08/16/2024 22  0 - 40 mg/dL Final   LDL Cholesterol 08/16/2024 96  0 - 99 mg/dL Final   Hgb J8r MFr Bld 08/16/2024 4.9  4.8 - 5.6 % Final   Mean Plasma Glucose 08/16/2024 93.93  mg/dL Final   RPR Ser Ql 89/95/7974 Reactive (A)  NON REACTIVE Final   RPR Titer 08/16/2024 1:2   Final   T Pallidum Abs 08/16/2024 Reactive (A)  Non Reactive Final     Vitals: Blood pressure 92/72, pulse 94, temperature 97.9 F (36.6 C), temperature source Oral, resp. rate 18, height 5' 1 (1.549 m), weight 59 kg, SpO2 100%.    Alan Maiden, MD

## 2024-09-02 NOTE — BHH Group Notes (Signed)
 Adult Psychoeducational Group Note  Date:  09/02/2024 Time:  8:49 PM  Group Topic/Focus:  Wrap-Up Group:   The focus of this group is to help patients review their daily goal of treatment and discuss progress on daily workbooks.  Participation Level:  Did Not Attend  Nancy Howell 09/02/2024, 8:49 PM

## 2024-09-02 NOTE — Group Note (Signed)
 Date:  09/02/2024 Time:  11:25 AM  Group Topic/Focus:  Pet Therapy:   This group aims to reduce stress and anxiety, improve mood, and increase feelings of comfort using animals.   Participation Level:  Patient did attend group   Keaten Mashek D Lestine Rahe 09/02/2024, 11:25 AM

## 2024-09-02 NOTE — Progress Notes (Signed)
 CSW and supervisor called patient's sister, [name], regarding patient's discharge plan. [name] agreed to pick patient up on 10/22 at discharge and assist patient with completion of [place] Medicaid application and social security application. Patient agreed to adhere to substance use treatment once Medicaid has been established. Treatment team made aware. Patient's sister, Stephane, will pick patient up at 10:00am on 10/22.  Taijah Macrae, LCSWA 09/02/24 4:15PM

## 2024-09-02 NOTE — Progress Notes (Signed)
   09/01/24 2045  Psych Admission Type (Psych Patients Only)  Admission Status Involuntary  Psychosocial Assessment  Patient Complaints None  Eye Contact Fair  Facial Expression Flat  Affect Preoccupied  Speech Slow;Soft  Interaction Childlike  Motor Activity Slow  Appearance/Hygiene Unremarkable  Behavior Characteristics Cooperative;Calm  Mood Preoccupied  Thought Process  Coherency Disorganized  Content Preoccupation  Delusions None reported or observed  Perception WDL  Hallucination None reported or observed  Judgment Impaired  Confusion None  Danger to Self  Current suicidal ideation? Denies  Danger to Others  Danger to Others None reported or observed

## 2024-09-02 NOTE — Progress Notes (Signed)
(  Sleep Hours) - 11 (Any PRNs that were needed, meds refused, or side effects to meds)- PRN vistaril 25 mg and trazodone 50 mg given at pt request, no meds refused.  (Any disturbances and when (visitation, over night)- None  (Concerns raised by the patient)- None  (SI/HI/AVH)- Denies SI/HI/AVH

## 2024-09-02 NOTE — [facility] Suicide Risk Assessment (Signed)
 [facility] INPATIENT:  Family/Significant Other Suicide Prevention Education  Suicide Prevention Education:  Education Completed; Sister, [name] [phone]  (name of family member/significant other) has been identified by the patient as the family member/significant other with whom the patient will be residing, and identified as the person(s) who will aid the patient in the event of a mental health crisis (suicidal ideations/suicide attempt).  With written consent from the patient, the family member/significant other has been provided the following suicide prevention education, prior to the and/or following the discharge of the patient.  [name] (sister) confirmed patient will not have access to firearms/guns/weapons after discharge. [name] reported she'd be willing to assist patient should she have a mental health crisis and need support. [name] reported she'd be willing to assist patient with safely storing and securing medications. [name] denied having safety concerns related to patient discharging home. [name] confirmed she will be at [facility] at 10:00AM to pick patient up at discharge.   The suicide prevention education provided includes the following: Suicide risk factors Suicide prevention and interventions National Suicide Hotline telephone number [facility] assessment telephone number [facility] Emergency Assistance 911 [facility] and/or Residential Mobile Crisis Unit telephone number  Request made of family/significant other to: Remove weapons (e.g., guns, rifles, knives), all items previously/currently identified as safety concern.   Remove drugs/medications (over-the-counter, prescriptions, illicit drugs), all items previously/currently identified as a safety concern.  The family member/significant other verbalizes understanding of the suicide prevention education information provided.  The family member/significant other agrees to remove the items of safety concern listed  above.  Laura Radilla M Seri Kimmer, LCSWA 09/02/2024, 4:17 PM

## 2024-09-02 NOTE — Group Note (Signed)
 Recreation Therapy Group Note   Group Topic:Coping Skills  Group Date: 09/02/2024 Start Time: 1055 End Time: 1115 Facilitators: Maragret Vanacker-McCall, LRT,CTRS Location: [address] Day[address]   Group Topic: Coping Skills    Goal Area(s) Addresses: Patient will define what a coping skill is. Patient will work with to create a list of healthy coping skills beginning with each letter of the alphabet. Patient will successfully identify positive coping skills they can use post d/c.  Patient will acknowledge benefit(s) of using learned coping skills post d/c.   Behavioral Response:    Intervention: Group work   Activity: Coping A to Z. Patient asked to identify what a coping skill is and when they use them. Patients with Clinical research associate discussed healthy versus unhealthy coping skills. Next patients were given a blank worksheet titled Coping Skills A-Z. Patients were instructed to come up with at least one positive coping skill per letter of the alphabet. Patients were given 15 minutes to brainstorm before ideas were presented to the large group. Patients and LRT debriefed on the importance of coping skill selection based on situation and back-up plans when a skill tried is not effective. At the end of group, patients were given an handout of alphabetized strategies to keep for future reference.   Education: Pharmacologist, Scientist, physiological, Discharge Planning.    Education Outcome: Acknowledges education/Verbalizes understanding/In group clarification offered/Additional education needed   Affect/Mood: N/A   Participation Level: Did not attend    Clinical Observations/Individualized Feedback:      Plan: Continue to engage patient in RT group sessions 2-3x/week.   Leonell Lobdell-McCall, LRT,CTRS 09/02/2024 1:22 PM

## 2024-09-02 NOTE — Plan of Care (Signed)
   Problem: Education: Goal: Emotional status will improve Outcome: Progressing Goal: Mental status will improve Outcome: Progressing Goal: Verbalization of understanding the information provided will improve Outcome: Progressing   Problem: Activity: Goal: Interest or engagement in activities will improve Outcome: Progressing

## 2024-09-02 NOTE — Plan of Care (Signed)
   Problem: Education: Goal: Knowledge of [facility] Education information/materials will improve Outcome: Progressing Goal: Emotional status will improve Outcome: Progressing Goal: Mental status will improve Outcome: Progressing Goal: Verbalization of understanding the information provided will improve Outcome: Progressing

## 2024-09-03 DIAGNOSIS — F529 Unspecified sexual dysfunction not due to a substance or known physiological condition: Secondary | ICD-10-CM

## 2024-09-03 MED ORDER — BENZTROPINE MESYLATE 2 MG PO TABS: 2.0000 mg | ORAL_TABLET | Freq: Two times a day (BID) | ORAL | 0 refills | Status: AC

## 2024-09-03 MED ORDER — OLANZAPINE 5 MG PO TABS
5.0000 mg | ORAL_TABLET | Freq: Every day | ORAL | Status: DC
  Filled 2024-09-03: qty 7

## 2024-09-03 MED ORDER — NICOTINE POLACRILEX 2 MG MT GUM: 2.0000 mg | CHEWING_GUM | OROMUCOSAL | 0 refills | Status: AC | PRN

## 2024-09-03 MED ORDER — METRONIDAZOLE 500 MG PO TABS: 500.0000 mg | ORAL_TABLET | Freq: Two times a day (BID) | ORAL | 0 refills | Status: AC

## 2024-09-03 MED ORDER — DIVALPROEX SODIUM 500 MG PO DR TAB: 500.0000 mg | DELAYED_RELEASE_TABLET | Freq: Two times a day (BID) | ORAL | 0 refills | Status: AC

## 2024-09-03 MED ORDER — HYDROXYZINE HCL 25 MG PO TABS: 25.0000 mg | ORAL_TABLET | Freq: Three times a day (TID) | ORAL | 0 refills | Status: AC | PRN

## 2024-09-03 MED ORDER — OLANZAPINE 5 MG PO TABS: 5.0000 mg | ORAL_TABLET | Freq: Every day | ORAL | 0 refills | Status: AC

## 2024-09-03 NOTE — Progress Notes (Signed)
   09/03/24 0100  Psych Admission Type (Psych Patients Only)  Admission Status Involuntary  Psychosocial Assessment  Patient Complaints None  Eye Contact Fair  Facial Expression Animated  Affect Preoccupied  Speech Slow;Soft  Interaction Childlike  Motor Activity Slow  Appearance/Hygiene Unremarkable  Behavior Characteristics Cooperative;Calm  Mood Preoccupied  Thought Process  Coherency Disorganized  Content Preoccupation  Delusions None reported or observed  Perception WDL  Hallucination None reported or observed  Judgment Impaired  Confusion None  Danger to Self  Current suicidal ideation? Denies  Agreement Not to Harm Self Yes  Description of Agreement verbal  Danger to Others  Danger to Others None reported or observed

## 2024-09-03 NOTE — Progress Notes (Signed)
  [facility] Adult Case Management Discharge Plan :  Will you be returning to the same living situation after discharge:  Yes,  pt returning to sister's home after discharge. At discharge, do you have transportation home?: Yes,  pt to be transported home by her sister at 10:00AM. Do you have the ability to pay for your medications: Yes,  pt reported having financial support from family to pay for medications.  Release of information consent forms completed and in the chart;  Patient's signature needed at discharge.  Patient to Follow up at:  Follow-up Information     [facility] Go on 09/04/2024.   Why: You have a hospital follow up appointment for therapy and medication management services on 09/04/24 at 8:00 am, in person . * YOU MUST BRING A PHOTO ID WITH YOU TO THIS APPT. Please also bring any insurance cards and medications. Contact information: [address] [place] [address] [phone]         [facility] Follow up.   Why: Referral made Contact information: [address] [place] [address] [phone]         [facility] Follow up.   Why: FOR PRIMARY CARE DOCTOR Contact information: [address] [place]  [phone]        [facility] Follow up.   Why: FOR PRIMARY CARE Environmental manager information: [address] [place]  [address] [phone]        [facility] / Disability Assistance Program / [facility] Follow up on 08/25/2024.   Why: A referral was sent on 08/15/2024. Please contact this offce on 08/25/2024 at 9 AM for an update on the status of the referral. Contact information: [address] [place], [place] [address] [phone]                Next level of care provider has access to [facility] Link:no  Safety Planning and Suicide Prevention discussed: Yes,  completed with  Darin [name]  [phone].     Has patient been referred to the Quitline?: Patient refused referral for treatment  Patient has been referred for addiction treatment: Yes, referral information given but appointment not made [facility] (list facility).  Raejean Swinford M Rosabel Sermeno, LCSWA 09/03/2024, 8:55 AM

## 2024-09-03 NOTE — [facility] Suicide Risk Assessment (Addendum)
 Suicide Risk Assessment  Discharge Assessment    [facility] Discharge Suicide Risk Assessment   Principal Problem: Severe stimulant use disorder [facility]) Discharge Diagnoses: Principal Problem:   Severe stimulant use disorder (HCC) Active Problems:   GAD (generalized anxiety disorder)   Schizoaffective disorder (HCC)   Total Time spent with patient: 30 minutes  Hospital Course:   During the patient's hospitalization, patient had extensive initial psychiatric evaluation, and follow-up psychiatric evaluations every day.   Psychiatric diagnoses provided upon initial assessment: Stimulant Use Disorder, severe dependence (cocaine)   Patient's psychiatric medications were adjusted on admission: Start Invega 3 mg while waiting last date of Invega Trinza injection and start Cogentin 2 mg   During the hospitalization, other adjustments were made to the patient's psychiatric medication regimen: After multiple trials, patient is now on olanzapine 5 mg at night, home dose of Depakote 500 mg twice daily, continued cogentin, and patient is due for next dose of Invega Trinza in December. Patient was thoroughly consulted on 2 forms of birth control while on Depakote. Patient's tubes are tied and she uses condoms.   Patient's care was discussed during the interdisciplinary team meeting every day during the hospitalization.   The patient experienced EPS while on thorazine, which was discontinued at the first incident of EPS, she denied having side effects to current prescribed psychiatric medication.   Gradually, patient started adjusting to milieu. The patient was evaluated each day by a clinical provider to ascertain response to treatment. Improvement was noted by the patient's report of decreasing symptoms, improved sleep and appetite, affect, medication tolerance, behavior, and participation in unit programming.  Patient was asked each day to complete a self inventory noting mood, mental status, pain, new  symptoms, anxiety and concerns.   Symptoms were reported as significantly decreased or resolved completely by discharge.  The patient reports that their mood is stable.  The patient denied having suicidal thoughts for more than 48 hours prior to discharge.  Patient denies having homicidal thoughts.  Patient denies having auditory hallucinations.  Patient denies any visual hallucinations or other symptoms of psychosis.  The patient was motivated to continue taking medication with a goal of continued improvement in mental health.    The patient reports their target psychiatric symptoms of psychosis responded well to the psychiatric medications, and the patient reports overall benefit other psychiatric hospitalization. Supportive psychotherapy was provided to the patient. The patient also participated in regular group therapy while hospitalized. Coping skills, problem solving as well as relaxation therapies were also part of the unit programming.   Labs were reviewed with the patient, and abnormal results were discussed with the patient.   The patient is able to verbalize their individual safety plan to this provider.   # It is recommended to the patient to continue psychiatric medications as prescribed, after discharge from the hospital.     # It is recommended to the patient to follow up with your outpatient psychiatric provider and PCP.   # It was discussed with the patient, the impact of alcohol, drugs, tobacco have been there overall psychiatric and medical wellbeing, and total abstinence from substance use was recommended the patient.ed.   # Prescriptions provided or sent directly to preferred pharmacy at discharge. Patient agreeable to plan. Given opportunity to ask questions. Appears to feel comfortable with discharge.    # In the event of worsening symptoms, the patient is instructed to call the crisis hotline, 911 and or go to the nearest ED for appropriate evaluation  and treatment of  symptoms. To follow-up with primary care provider for other medical issues, concerns and or health care needs   # Patient was discharged to sister's house with a plan to follow up as noted below.       On day of discharge patient is found wandering the unit.  On assessment, patient states her mood is hopeful and happy, stating she is ready to go to her sister's house and happy that her sister is taking her in.  Patient asked about her medication refills and asked about how she will afford her medications, and patient was consulted about different coupon services such as [facility] and different pharmacies which have medications cheaper such as [facility].  Patient was consulted that she would just get 30-day supply of her medications and that she needs to go to the outpatient psychiatric appointment we have set up for her for continued refills of her medications.  Patient was reminded that Invega shot was doing December.  Patient denies SI, HI, and AVH.  Patient was reminded in order to continue Depakote she needs to have 2 forms of birth control, in which the patient agreed.  Musculoskeletal: Strength & Muscle Tone: within normal limits Gait & Station: normal Patient leans: N/A  Psychiatric Specialty Exam  Presentation  General Appearance:  Appropriate for Environment; Casual  Eye Contact: Good  Speech: Clear and Coherent; Normal Rate (Child-like)  Speech Volume: Decreased  Handedness:No data recorded  Mood and Affect  Mood: -- (I feel good, I am hopeful and happy)  Duration of Depression Symptoms: No data recorded Affect: Congruent   Thought Process  Thought Processes: Coherent  Descriptions of Associations:Tangential  Orientation:Full (Time, Place and Person)  Thought Content:Logical  History of Schizophrenia/Schizoaffective disorder:Yes  Duration of Psychotic Symptoms:Greater than six months  Hallucinations:Hallucinations: None  Ideas of  Reference:None  Suicidal Thoughts:Suicidal Thoughts: No  Homicidal Thoughts:Homicidal Thoughts: No   Sensorium  Memory: Immediate Good  Judgment: Fair  Insight: Poor   Executive Functions  Concentration:No data recorded Attention Span:No data recorded Recall:No data recorded Fund of Knowledge:No data recorded Language: Good   Psychomotor Activity  Psychomotor Activity: Psychomotor Activity: Normal   Assets  Assets: Desire for Improvement; Resilience   Sleep  Sleep: Sleep: Good  Estimated Sleeping Duration (Last 24 Hours): 7.00-7.75 hours  Physical Exam: Physical Exam Constitutional:      General: She is not in acute distress.    Appearance: Normal appearance. She is normal weight. She is not ill-appearing or toxic-appearing.  HENT:     Head: Normocephalic and atraumatic.  Pulmonary:     Effort: Pulmonary effort is normal.  Neurological:     General: No focal deficit present.     Mental Status: She is alert.    Review of Systems  Gastrointestinal:  Negative for abdominal pain, constipation, diarrhea, nausea and vomiting.  Neurological:  Negative for dizziness and headaches.  Psychiatric/Behavioral:  Negative for hallucinations and suicidal ideas. The patient is not nervous/anxious.    Blood pressure 104/77, pulse 95, temperature 97.9 F (36.6 C), temperature source Oral, resp. rate 18, height 5' 1 (1.549 m), weight 59 kg, SpO2 100%. Body mass index is 24.58 kg/m.  Mental Status Per Nursing Assessment::   On Admission:  Self-harm thoughts  Demographic Factors:  Low socioeconomic status and Unemployed  Loss Factors: Financial problems/change in socioeconomic status  Historical Factors: NA  Risk Reduction Factors:   Living with another person, especially a relative  Continued Clinical Symptoms:  Schizophrenia:  Paranoid or undifferentiated type  Cognitive Features That Contribute To Risk:  None    Suicide Risk:  Minimal: No  identifiable suicidal ideation.  Patients presenting with no risk factors but with morbid ruminations; may be classified as minimal risk based on the severity of the depressive symptoms   Follow-up Information     [facility] Go on 09/04/2024.   Why: You have a hospital follow up appointment for therapy and medication management services on 09/04/24 at 8:00 am, in person . * YOU MUST BRING A PHOTO ID WITH YOU TO THIS APPT. Please also bring any insurance cards and medications. Contact information: [address] [place] [address] [phone]         [facility] Follow up.   Why: Referral made Contact information: [address] [place] [address] [phone]         [facility] Follow up.   Why: FOR PRIMARY CARE DOCTOR Contact information: [address] [place]  [address] [phone]        [facility] Follow up.   Why: FOR PRIMARY CARE Environmental manager information: [address] [place]   [address] [phone]        [facility] / Disability Assistance Program / [facility] Follow up on 08/25/2024.   Why: A referral was sent on 08/15/2024. Please contact this offce on 08/25/2024 at 9 AM for an update on the status of the referral. Contact information: [address] [place], [place] [address] [phone]                Plan Of Care/Follow-up recommendations:  Activity: as tolerated   Diet: heart healthy   Other: -Follow-up with your outpatient psychiatric provider -instructions on appointment date, time, and address (location) are provided to you in discharge paperwork.   -Take your psychiatric medications as prescribed at discharge - instructions are provided to you in the discharge paperwork   -Follow-up with outpatient primary care doctor and other specialists -for management of chronic medical disease, including:  seizures   -Testing: Follow-up with outpatient provider for abnormal lab results: N/A   -Recommend abstinence from alcohol, tobacco, and other illicit drug use at discharge.    -If your psychiatric symptoms recur, worsen, or if you have side effects to your psychiatric medications, call your outpatient psychiatric provider, 911, [phone] or go to the nearest emergency department.   -If suicidal thoughts recur, call your outpatient psychiatric provider, 911, [phone] or go to the nearest emergency department.    Alan Maiden, MD 09/03/2024, 8:15 AM

## 2024-09-03 NOTE — Progress Notes (Signed)
 D: Pt A & O X 3. Denies SI, HI, AVH and pain at this time. D/C home as ordered. Picked up in lobby by her sister. A: D/C instructions reviewed with pt and sister including prescriptions, medication samples and follow up appointments; compliance encouraged. All belongings from locker 48 returned to pt at time of departure. Scheduled medications given with verbal education and effects monitored. Safety checks maintained without incident till time of d/c.  R: Pt receptive to care. Compliant with medications when offered. Denies adverse drug reactions when assessed. Verbalized understanding related to d/c instructions. Signed belonging sheet in agreement with items received from locker. Ambulatory with a steady gait. Appears to be in no physical distress at time of departure.

## 2024-09-03 NOTE — Discharge Summary (Addendum)
 Physician Discharge Summary Note  Patient:  Nancy Howell is an 48 y.o., female MRN:  [id number] DOB:  September 22, 1976 Patient phone:  [phone] (home)  Patient address:   [address] [place] [address],  Total Time spent with patient: 30 minutes  Date of Admission:  08/12/2024 Date of Discharge: 09/03/2024  Reason for Admission:   History of Present Illness: Nancy Howell is a 48 year old female with a past psychiatric history of schizoaffective disorder bipolar type, GAD and cocaine use disorder with recent cocaine use who presented to ED on 09/30 for suicidal ideation.  She is currently admitted on a voluntary basis to the behavioral health hospital.   On interview today, the patient initially discusses appropriate topics.  Eventually she does discuss delusional thought content about evil spirits and her father making clones of her when she was younger.  She is amenable to redirection.  She does not appear to be responding to internal stimuli.   During today's interview, she denies any current or recent suicidal ideation, noting that she needed to get inpatient treatment for her schizophrenia and her substance use. She reports recently moving back to [place] from [place]  to start a new life with her sister, with whom she'll be living. Back in [place] , she was unhoused and regularly using crack cocaine, and not following up with any medical provider or treatment. She last used crack on the car ride to [place] . He sister, who drove to [place] to pick her up, noted she was high after a rest stop, confiscated her paraphernalia and discarded it. She denies any other substance use at this time. She notes her mood, appetite and sleep are good. When asked about hallucinations, she denies any current AVH, but believes she can see and hear spirits, and describes previous episodes of seeing and hearing spirits who are sometimes evil and move objects in her surroundings, including one episode yesterday  at the ED. She reports a history of paranoid delusions, but denies any delusional symptoms at this time. However, she also believes that she has HIV and cancer, described first as leukemia and later as breast cancer, for which she received chemotherapy while incarcerated in 2014. She also states her father mixed medication with her drugs which she would smoke and inject. Of note, there is no record of a cancer diagnosis in her EMR, had a normal CBC, and she has a history of reporting medical issues inconsistent with exam findings (In 2012, she reported history of a stroke with enduring left sided weakness, but was neurologically intact with a normal head CT). She is aware of a non-reactive HIV antibody test, but believes the disease in her body is undetectable. 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    She reports prior diagnoses of schizophrenia, bipolar I, and depression. She reports a recent Invega injection, but the timeline is unclear as she stated she received it last 3 months ago, and later said it was actually last week, but denies seeing any medical providers, psychiatrists or therapists in several years. She has previously been treated with Zyprexa, Depakote and Haldol , which she reports have not worked well and/or had too many side effects. She also states she was on 2 mg of Cogentin, which she would like to restart for medication side effects.She reports one previous instance of self-harm and a suicide attempt precipitated by the murder of her mother and evil spirits commanding her to kill herself. She denies any  history of violence against others.     She expresses concerns for her physical and mental health and a desire to change and improve her living conditions. She finds a strong support system in her sister and her family, who live in [place] and [address]    Attempted to speak to sister, [name], number listed in the chart.  No answer.  Mailbox full,  unable to leave voicemail.  Principal Problem: Severe stimulant use disorder [facility]) Discharge Diagnoses: Principal Problem:   Severe stimulant use disorder (HCC) Active Problems:   GAD (generalized anxiety disorder)   Schizoaffective disorder (HCC)   Past Psychiatric History: Schizoaffective disorder, Bipolar I Disorder, Substance-induced mood disorder, GAD   Past Medical History:  Past Medical History:  Diagnosis Date   Bipolar 1 disorder (HCC)    Schizo affective schizophrenia (HCC)    History reviewed. No pertinent surgical history. Family History: History reviewed. No pertinent family history. Family Psychiatric  History: None pertinent  Social History:  Social History   Substance and Sexual Activity  Alcohol Use Yes   Comment: socially     Social History   Substance and Sexual Activity  Drug Use No    Social History   Socioeconomic History   Marital status: Single    Spouse name: Not on file   Number of children: Not on file   Years of education: Not on file   Highest education level: Not on file  Occupational History   Not on file  Tobacco Use   Smoking status: Every Day    Current packs/day: 0.50    Types: Cigarettes   Smokeless tobacco: Never  Substance and Sexual Activity   Alcohol use: Yes    Comment: socially   Drug use: No   Sexual activity: Not on file  Other Topics Concern   Not on file  Social History Narrative   Not on file   Social Drivers of Health   Financial Resource Strain: Not on file  Food Insecurity: No Food Insecurity (08/12/2024)   Hunger Vital Sign    Worried About Running Out of Food in the Last Year: Never true    Ran Out of Food in the Last Year: Never true  Transportation Needs: No Transportation Needs (08/12/2024)   PRAPARE - Administrator, Civil Service (Medical): No    Lack of Transportation (Non-Medical): No  Physical Activity: Not on file  Stress: Not on file  Social Connections: Not on file     Hospital Course:   During the patient's hospitalization, patient had extensive initial psychiatric evaluation, and follow-up psychiatric evaluations every day.  Psychiatric diagnoses provided upon initial assessment: Stimulant Use Disorder, severe dependence (cocaine)  Patient's psychiatric medications were adjusted on admission: Start Invega 3 mg while waiting last date of Invega Trinza injection and start Cogentin 2 mg  During the hospitalization, other adjustments were made to the patient's psychiatric medication regimen: After multiple trials, patient is now on olanzapine 5 mg at night, home dose of Depakote 500 mg twice daily, continued cogentin, and patient is due for next dose of Invega Trinza in December. Patient was thoroughly consulted on 2 forms of birth control while on Depakote. Patient's tubes are tied and she uses condoms.  Patient's care was discussed during the interdisciplinary team meeting every day during the hospitalization.  The patient experienced EPS while on thorazine, which was discontinued at the first incident of EPS, she denied having side effects to current prescribed psychiatric medication.  Gradually, patient started adjusting to milieu. The patient was evaluated each day by a clinical provider to ascertain response to treatment. Improvement was noted by the patient's report of decreasing symptoms, improved sleep and appetite, affect, medication tolerance, behavior, and participation in unit programming.  Patient was asked each day to complete a self inventory noting mood, mental status, pain, new symptoms, anxiety and concerns.   Symptoms were reported as significantly decreased or resolved completely by discharge.  The patient reports that their mood is stable.  The patient denied having suicidal thoughts for more than 48 hours prior to discharge.  Patient denies having homicidal thoughts.  Patient denies having auditory hallucinations.  Patient denies any  visual hallucinations or other symptoms of psychosis.  The patient was motivated to continue taking medication with a goal of continued improvement in mental health.   The patient reports their target psychiatric symptoms of psychosis responded well to the psychiatric medications, and the patient reports overall benefit other psychiatric hospitalization. Supportive psychotherapy was provided to the patient. The patient also participated in regular group therapy while hospitalized. Coping skills, problem solving as well as relaxation therapies were also part of the unit programming.  Labs were reviewed with the patient, and abnormal results were discussed with the patient.  The patient is able to verbalize their individual safety plan to this provider.  # It is recommended to the patient to continue psychiatric medications as prescribed, after discharge from the hospital.    # It is recommended to the patient to follow up with your outpatient psychiatric provider and PCP.  # It was discussed with the patient, the impact of alcohol, drugs, tobacco have been there overall psychiatric and medical wellbeing, and total abstinence from substance use was recommended the patient.ed.  # Prescriptions provided or sent directly to preferred pharmacy at discharge. Patient agreeable to plan. Given opportunity to ask questions. Appears to feel comfortable with discharge.    # In the event of worsening symptoms, the patient is instructed to call the crisis hotline, 911 and or go to the nearest ED for appropriate evaluation and treatment of symptoms. To follow-up with primary care provider for other medical issues, concerns and or health care needs  # Patient was discharged to sister's house with a plan to follow up as noted below.    On day of discharge patient is found wandering the unit.  On assessment, patient states her mood is hopeful and happy, stating she is ready to go to her sister's house and happy  that her sister is taking her in.  Patient asked about her medication refills and asked about how she will afford her medications, and patient was consulted about different coupon services such as [facility] and different pharmacies which have medications cheaper such as [facility].  Patient was consulted that she would just get 30-day supply of her medications and that she needs to go to the outpatient psychiatric appointment we have set up for her for continued refills of her medications.  Patient was reminded that Invega shot was doing December.  Patient denies SI, HI, and AVH.  Patient was reminded in order to continue Depakote she needs to have 2 forms of birth control, in which the patient agreed.  Physical Findings: AIMS:  , ,  ,  ,  ,  ,   CIWA:    COWS:     Musculoskeletal: Strength & Muscle Tone: within normal limits Gait & Station: normal Patient leans: N/A   Psychiatric Specialty  Exam:  Presentation  General Appearance:  Appropriate for Environment; Casual  Eye Contact: Good  Speech: Clear and Coherent; Normal Rate (Child-like)  Speech Volume: Decreased  Handedness:No data recorded  Mood and Affect  Mood: -- (I feel good, I am hopeful and happy)  Affect: Congruent   Thought Process  Thought Processes: Coherent  Descriptions of Associations:Tangential  Orientation:Full (Time, Place and Person)  Thought Content:Logical  History of Schizophrenia/Schizoaffective disorder:Yes  Duration of Psychotic Symptoms:Greater than six months  Hallucinations:Hallucinations: None  Ideas of Reference:None  Suicidal Thoughts:Suicidal Thoughts: No  Homicidal Thoughts:Homicidal Thoughts: No   Sensorium  Memory: Immediate Good  Judgment: Fair  Insight: Poor   Executive Functions  Concentration:No data recorded Attention Span:No data recorded Recall:No data recorded Fund of Knowledge:No data recorded Language: Good   Psychomotor Activity  Psychomotor  Activity:Psychomotor Activity: Normal   Assets  Assets: Desire for Improvement; Resilience   Sleep  Sleep:Sleep: Good  Estimated Sleeping Duration (Last 24 Hours): 7.00-7.75 hours   Physical Exam: Physical Exam Constitutional:      General: She is not in acute distress.    Appearance: Normal appearance. She is normal weight. She is not ill-appearing or toxic-appearing.  HENT:     Head: Normocephalic and atraumatic.  Pulmonary:     Effort: Pulmonary effort is normal.    Review of Systems  Gastrointestinal:  Negative for abdominal pain, constipation, diarrhea, nausea and vomiting.  Neurological:  Negative for dizziness and headaches.  Psychiatric/Behavioral:  Negative for hallucinations and suicidal ideas. The patient is not nervous/anxious.    Blood pressure 104/77, pulse 95, temperature 97.9 F (36.6 C), temperature source Oral, resp. rate 18, height 5' 1 (1.549 m), weight 59 kg, SpO2 100%. Body mass index is 24.58 kg/m.   Social History   Tobacco Use  Smoking Status Every Day   Current packs/day: 0.50   Types: Cigarettes  Smokeless Tobacco Never   Tobacco Cessation:  A prescription for an FDA-approved tobacco cessation medication provided at discharge   Blood Alcohol level:  Lab Results  Component Value Date   [facility] <15 08/12/2024   ETH <11 08/02/2011    Metabolic Disorder Labs:  Lab Results  Component Value Date   HGBA1C 4.9 08/16/2024   MPG 93.93 08/16/2024   No results found for: PROLACTIN Lab Results  Component Value Date   CHOL 172 08/16/2024   TRIG 108 08/16/2024   HDL 54 08/16/2024   CHOLHDL 3.2 08/16/2024   VLDL 22 08/16/2024   LDLCALC 96 08/16/2024    See Psychiatric Specialty Exam and Suicide Risk Assessment completed by Attending Physician prior to discharge.  Discharge destination:  Home  Is patient on multiple antipsychotic therapies at discharge:  No   Has Patient had three or more failed trials of antipsychotic monotherapy  by history:  Failed Haldol  due to perceived allergy, thorazine due to onset of EPS, and Invega as patient refused due to Invega injection in system  Recommended Plan for Multiple Antipsychotic Therapies: Additional reason(s) for multiple antispychotic treatment:  Invega injection did not adequately control patient's psychosis   Allergies as of 09/03/2024       Reactions   Banana Other (See Comments)   Chest pains   Penicillins Hives, Swelling, Other (See Comments)   Has patient had a PCN reaction causing immediate rash, facial/tongue/throat swelling, SOB or lightheadedness with hypotension: yes Has patient had a PCN reaction causing severe rash involving mucus membranes or skin necrosis: unknown Has patient had a PCN reaction that required  hospitalization: yes, visit Has patient had a PCN reaction occurring within the last 10 years: yes If all of the above answers are NO, then may proceed with Cephalosporin use.        Medication List     STOP taking these medications    diphenhydrAMINE 25 mg capsule Commonly known as: BENADRYL   INVEGA SUSTENNA IM Replaced by: Invega Trinza 546 MG/1.75ML injection       TAKE these medications      Indication  benztropine 2 MG tablet Commonly known as: COGENTIN Take 1 tablet (2 mg total) by mouth 2 (two) times daily.  Indication: Extrapyramidal Reaction caused by Medications   divalproex 500 MG DR tablet Commonly known as: DEPAKOTE Take 1 tablet (500 mg total) by mouth every 12 (twelve) hours.  Indication: FOCAL SEIZURE WITH IMPAIRED AWARENESS   hydrOXYzine 25 MG tablet Commonly known as: ATARAX Take 1 tablet (25 mg total) by mouth 3 (three) times daily as needed for anxiety.  Indication: Feeling Anxious   Invega Trinza 546 MG/1.75ML injection Generic drug: Paliperidone Palmitate ER Inject 1.75 mLs (546 mg total) into the muscle once for 1 dose. Start taking on: October 20, 2024 Replaces: INVEGA SUSTENNA IM  Indication:  Schizophrenia   metroNIDAZOLE 500 MG tablet Commonly known as: FLAGYL Take 1 tablet (500 mg total) by mouth 2 (two) times daily.  Indication: Vaginosis caused by Bacteria   nicotine polacrilex 2 MG gum Commonly known as: NICORETTE Take 1 each (2 mg total) by mouth as needed for smoking cessation.  Indication: Nicotine Addiction   OLANZapine 5 MG tablet Commonly known as: ZYPREXA Take 1 tablet (5 mg total) by mouth at bedtime.  Indication: Schizophrenia        Follow-up Information     [facility] Go on 09/04/2024.   Why: You have a hospital follow up appointment for therapy and medication management services on 09/04/24 at 8:00 am, in person . * YOU MUST BRING A PHOTO ID WITH YOU TO THIS APPT. Please also bring any insurance cards and medications. Contact information: [address] [place] [address] [phone]         Services, [facility] Follow up.   Why: Referral made Contact information: [address] [place] [address] [phone]         [facility] Follow up.   Why: FOR PRIMARY CARE DOCTOR Contact information: [address] [place]  [address] [phone]        [facility] Follow up.   Why: FOR PRIMARY CARE Environmental manager information: [address] [place]   [phone]        [facility] / Disability Assistance Program / [facility] Follow up on 08/25/2024.   Why: A referral was sent on 08/15/2024. Please contact this offce on 08/25/2024 at 9 AM for an update on the status of the referral. Contact information: [address] [place], [place] [address] [phone]                Follow-up recommendations/ Comments:    Activity: as tolerated  Diet: heart healthy  Other: -Follow-up with your outpatient psychiatric provider -instructions on appointment date, time, and address (location) are provided  to you in discharge paperwork.  -Take your psychiatric medications as prescribed at discharge - instructions are provided to you in the discharge paperwork  -Follow-up with outpatient primary care doctor and other specialists -for management of  chronic medical disease, including: seizures  -Testing: Follow-up with outpatient provider for abnormal lab results: N/A  -Recommend abstinence from alcohol, tobacco, and other illicit drug use at discharge.   -If your psychiatric symptoms recur, worsen, or if you have side effects to your psychiatric medications, call your outpatient psychiatric provider, 911, [phone] or go to the nearest emergency department.  -If suicidal thoughts recur, call your outpatient psychiatric provider, 911, [phone] or go to the nearest emergency department.   Signed: Alan Maiden, MD 09/03/2024, 8:11 AM

## 2024-09-03 NOTE — [facility] Counselor (Signed)
 CSW brought documentation to patient for her signature for [place] benefits with assistance from financial counselor, Harlene DEL. Patient signed documents. CSW scanned signed documents to Harlene H for processing. Harlene to follow-up with patient after discharge regarding status updates.   Zettie Gootee, LCSWA 09/03/24 9:08AM

## 2024-09-03 NOTE — Plan of Care (Signed)
   Problem: Education: Goal: Knowledge of [facility] Education information/materials will improve Outcome: Progressing Goal: Emotional status will improve Outcome: Progressing Goal: Mental status will improve Outcome: Progressing Goal: Verbalization of understanding the information provided will improve Outcome: Progressing
